# Patient Record
Sex: Female | Born: 1965 | Race: White | Hispanic: No | Smoking: Current every day smoker
Health system: Southern US, Community
[De-identification: ages and names within clinical notes are randomized; demographics above are authoritative.]

## PROBLEM LIST (undated history)

## (undated) DIAGNOSIS — D649 Anemia, unspecified: Secondary | ICD-10-CM

## (undated) DIAGNOSIS — N6092 Unspecified benign mammary dysplasia of left breast: Principal | ICD-10-CM

## (undated) DIAGNOSIS — O903 Peripartum cardiomyopathy: Secondary | ICD-10-CM

## (undated) DIAGNOSIS — R94130 Abnormal response to nerve stimulation, unspecified: Secondary | ICD-10-CM

## (undated) DIAGNOSIS — I509 Heart failure, unspecified: Secondary | ICD-10-CM

## (undated) HISTORY — DX: Peripartum cardiomyopathy: O90.3

## (undated) HISTORY — PX: TUBAL LIGATION: SHX77

## (undated) HISTORY — DX: Abnormal response to nerve stimulation, unspecified: R94.130

---
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
## 1987-05-30 HISTORY — PX: DILATION AND CURETTAGE OF UTERUS: SHX78

## 1997-12-21 ENCOUNTER — Other Ambulatory Visit: Admission: RE | Admit: 1997-12-21 | Discharge: 1997-12-21 | Payer: Self-pay | Admitting: *Deleted

## 1998-12-23 ENCOUNTER — Other Ambulatory Visit: Admission: RE | Admit: 1998-12-23 | Discharge: 1998-12-23 | Payer: Self-pay | Admitting: *Deleted

## 1999-06-28 ENCOUNTER — Encounter: Payer: Self-pay | Admitting: *Deleted

## 1999-06-28 ENCOUNTER — Emergency Department (HOSPITAL_COMMUNITY): Admission: EM | Admit: 1999-06-28 | Discharge: 1999-06-28 | Payer: Self-pay | Admitting: Emergency Medicine

## 2002-11-03 ENCOUNTER — Emergency Department (HOSPITAL_COMMUNITY): Admission: EM | Admit: 2002-11-03 | Discharge: 2002-11-03 | Payer: Self-pay | Admitting: Emergency Medicine

## 2003-10-17 ENCOUNTER — Emergency Department (HOSPITAL_COMMUNITY): Admission: EM | Admit: 2003-10-17 | Discharge: 2003-10-17 | Payer: Self-pay | Admitting: *Deleted

## 2004-05-29 HISTORY — PX: SPINE SURGERY: SHX786

## 2004-08-16 ENCOUNTER — Ambulatory Visit (HOSPITAL_COMMUNITY): Admission: RE | Admit: 2004-08-16 | Discharge: 2004-08-17 | Payer: Self-pay | Admitting: Neurosurgery

## 2005-07-06 ENCOUNTER — Emergency Department (HOSPITAL_COMMUNITY): Admission: EM | Admit: 2005-07-06 | Discharge: 2005-07-06 | Payer: Self-pay | Admitting: Emergency Medicine

## 2010-05-03 LAB — HM PAP SMEAR

## 2012-05-17 ENCOUNTER — Other Ambulatory Visit (HOSPITAL_COMMUNITY): Payer: Self-pay | Admitting: Family Medicine

## 2012-05-17 DIAGNOSIS — O903 Peripartum cardiomyopathy: Secondary | ICD-10-CM

## 2012-06-07 ENCOUNTER — Ambulatory Visit (HOSPITAL_COMMUNITY)
Admission: RE | Admit: 2012-06-07 | Discharge: 2012-06-07 | Disposition: A | Discharge status: Home / Self Care | Payer: 59 | Source: Ambulatory Visit | Attending: Family Medicine | Admitting: Family Medicine

## 2012-06-07 DIAGNOSIS — I428 Other cardiomyopathies: Secondary | ICD-10-CM | POA: Diagnosis present

## 2012-06-07 DIAGNOSIS — O903 Peripartum cardiomyopathy: Secondary | ICD-10-CM

## 2012-06-07 DIAGNOSIS — F17200 Nicotine dependence, unspecified, uncomplicated: Secondary | ICD-10-CM | POA: Insufficient documentation

## 2012-06-07 NOTE — Progress Notes (Signed)
[facility]   2D echo completed 06/07/2012.   Veda Canning, RDCS

## 2012-09-05 ENCOUNTER — Encounter: Payer: Self-pay | Admitting: *Deleted

## 2012-09-06 ENCOUNTER — Ambulatory Visit (INDEPENDENT_AMBULATORY_CARE_PROVIDER_SITE_OTHER): Payer: 59 | Admitting: Physician Assistant

## 2012-09-06 ENCOUNTER — Ambulatory Visit: Payer: Self-pay | Admitting: Physician Assistant

## 2012-09-06 ENCOUNTER — Encounter: Payer: Self-pay | Admitting: Physician Assistant

## 2012-09-06 VITALS — BP 142/88 | HR 92 | Temp 98.8°F | Resp 18 | Ht 64.5 in | Wt 172.0 lb

## 2012-09-06 DIAGNOSIS — J988 Other specified respiratory disorders: Secondary | ICD-10-CM

## 2012-09-06 DIAGNOSIS — A499 Bacterial infection, unspecified: Secondary | ICD-10-CM

## 2012-09-06 DIAGNOSIS — B9689 Other specified bacterial agents as the cause of diseases classified elsewhere: Secondary | ICD-10-CM

## 2012-09-06 MED ORDER — HYDROCODONE-HOMATROPINE 5-1.5 MG/5ML PO SYRP: 5.0000 mL | ORAL_SOLUTION | Freq: Three times a day (TID) | ORAL | Status: DC | PRN

## 2012-09-06 NOTE — Progress Notes (Signed)
   Patient ID: Lisa Gates MRN: [id number], DOB: May 21, 1966, 47 y.o. Date of Encounter: 09/06/2012, 11:41 AM    Chief Complaint:  Chief Complaint  Patient presents with  . Cough    persistant x 1 week  worse at night  [facility]     HPI: 47 y.o. year old female says she has had this congestion for about 2 weeks. Was unable to get an appt here [facility] "but wasnot very impressed with them" so here for f/u. Has a lot of drainage in throat causing cough. Gets very little from nose. No sore throat, ear pain, fever, chills.  [facility] prescribed Doxycycline 100 BID x 7days, tessalon, and cheratussin.  Still has lots of cough at night. Not getting much sleep.    Home Meds: Current Outpatient Prescriptions on File Prior to Visit  Medication Sig Dispense Refill  . promethazine (PHENERGAN) 25 MG tablet Take 25 mg by mouth every 6 (six) hours as needed for nausea.       No current facility-administered medications on file prior to visit.    Allergies:  Allergies  Allergen Reactions  . Penicillins       Review of Systems: Constitutional: negative for chills, fever, night sweats, weight changes, or fatigue  HEENT: negative for vision changes, hearing loss, congestion, rhinorrhea, ST, epistaxis, or sinus pressure Cardiovascular: negative for chest pain or palpitations Respiratory: negative for hemoptysis, wheezing, shortness of breath Abdominal: negative for abdominal pain, nausea, vomiting, diarrhea, or constipation Dermatological: negative for rash Neurologic: negative for headache, dizziness, or syncope    Physical Exam: Blood pressure 142/88, pulse 92, temperature 98.8 F (37.1 C), temperature source Oral, resp. rate 18, height 5' 4.5" (1.638 m), weight 172 lb (78.019 kg), last menstrual period 08/15/2012., Body mass index is 29.08 kg/(m^2). General: Well developed, well nourished,WF in no acute distress. HEENT: Normocephalic,  atraumatic, eyes without discharge, sclera non-icteric, nares are without discharge. Bilateral auditory canals clear, TM's are without perforation, pearly grey and translucent with reflective cone of light bilaterally. Oral cavity moist, posterior pharynx without exudate, erythema, peritonsillar abscess. Some post nasal drip.  Neck: Supple. No thyromegaly. Full ROM. No lymphadenopathy. Lungs: Clear bilaterally to auscultation without wheezes, rales, or rhonchi. Breathing is unlabored. Heart: RRR with S1 S2. No murmurs, rubs, or gallops. Msk:  Strength and tone normal for age. Extremities/Skin: Warm and dry. No clubbing or cyanosis. No edema. No rashes or suspicious lesions. Neuro: Alert and oriented X 3. Moves all extremities spontaneously. Gait is normal. CNII-XII grossly in tact. Psych:  Responds to questions appropriately with a normal affect.    ASSESSMENT AND PLAN:  47 y.o. year old female with  1. Bacterial respiratory infection Complete Doxycycline. If cough does not resolve after completion of abx, f/u. Will prescribe a different anti-tussive to use instead of current cough suppressants. - HYDROcodone-homatropine (HYCODAN) 5-1.5 MG/5ML syrup; Take 5 mLs by mouth every 8 (eight) hours as needed for cough.  Dispense: 120 mL; Refill: 0   Signed, [address] [place], [place], [facility] 09/06/2012 11:41 AM

## 2013-01-30 ENCOUNTER — Ambulatory Visit (INDEPENDENT_AMBULATORY_CARE_PROVIDER_SITE_OTHER): Payer: 59 | Admitting: Physician Assistant

## 2013-01-30 ENCOUNTER — Encounter: Payer: Self-pay | Admitting: Physician Assistant

## 2013-01-30 VITALS — BP 136/88 | HR 84 | Temp 98.2°F | Resp 18 | Wt 176.0 lb

## 2013-01-30 DIAGNOSIS — M79622 Pain in left upper arm: Secondary | ICD-10-CM

## 2013-01-30 DIAGNOSIS — M79609 Pain in unspecified limb: Secondary | ICD-10-CM

## 2013-01-30 NOTE — Telephone Encounter (Signed)
This encounter was created in error - please disregard.
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
## 2013-01-30 NOTE — Telephone Encounter (Signed)
Can you make erroneous please? She called saying she needed to leave a message, but just needed to make an appt. Sorry!

## 2013-03-01 DIAGNOSIS — R94130 Abnormal response to nerve stimulation, unspecified: Secondary | ICD-10-CM

## 2013-03-01 HISTORY — DX: Abnormal response to nerve stimulation, unspecified: R94.130

## 2013-04-23 ENCOUNTER — Encounter: Payer: Self-pay | Admitting: Family Medicine

## 2013-11-12 ENCOUNTER — Ambulatory Visit (INDEPENDENT_AMBULATORY_CARE_PROVIDER_SITE_OTHER): Payer: 59 | Admitting: Physician Assistant

## 2013-11-12 ENCOUNTER — Encounter: Payer: Self-pay | Admitting: Physician Assistant

## 2013-11-12 VITALS — BP 122/82 | HR 80 | Temp 98.4°F | Resp 18 | Wt 173.0 lb

## 2013-11-12 DIAGNOSIS — J029 Acute pharyngitis, unspecified: Secondary | ICD-10-CM

## 2013-11-12 LAB — RAPID STREP SCREEN (MED CTR MEBANE ONLY): Streptococcus, Group A Screen (Direct): NEGATIVE

## 2013-11-12 MED ORDER — AZITHROMYCIN 250 MG PO TABS: ORAL_TABLET | ORAL | Status: DC

## 2013-11-12 NOTE — Progress Notes (Signed)
Patient ID: Lisa Gates MRN: [id number], DOB: December 06, 1965, 48 y.o. Date of Encounter: 11/12/2013, 11:23 AM    Chief Complaint:  Chief Complaint  Patient presents with  . fever/sore throat x 2 days    using OTC Dayquil/Motrin     HPI: 48 y.o. year old white female says that this started on the afternoon of Sunday 11/09/13. Says she has had a fever between 99 - 100. Has had pain in the left side of her throat. Says she "feels like crap". However has had no significant nasal congestion or mucous from her nose. Also no cough. Says the pain in her throat has gotten no better. She has continued to have a fever of 99-100 especially in the evening/night.     Home Meds:   Outpatient Prescriptions Prior to Visit  Medication Sig Dispense Refill  . guaiFENesin-codeine (ROBITUSSIN AC) 100-10 MG/5ML syrup Take 5 mLs by mouth at bedtime as needed for cough.      Marland Kitchen. HYDROcodone-homatropine (HYCODAN) 5-1.5 MG/5ML syrup Take 5 mLs by mouth every 8 (eight) hours as needed for cough.  120 mL  0  . promethazine (PHENERGAN) 25 MG tablet Take 25 mg by mouth every 6 (six) hours as needed for nausea.       No facility-administered medications prior to visit.    Allergies:  Allergies  Allergen Reactions  . Penicillins       Review of Systems: See HPI for pertinent ROS. All other ROS negative.    Physical Exam: Blood pressure 122/82, pulse 80, temperature 98.4 F (36.9 C), temperature source Oral, resp. rate 18, weight 173 lb (78.472 kg)., Body mass index is 29.25 kg/(m^2). General: WNWD WF. Appears in no acute distress. HEENT: Normocephalic, atraumatic, eyes without discharge, sclera non-icteric, nares are without discharge. Bilateral auditory canals clear, TM's are without perforation, pearly grey and translucent with reflective cone of light bilaterally. Oral cavity moist. The left tonsil has moderate erythema and small amount of exudate along the medial edge. Right tonsil with minimal  erythema and no exudate. No peritonsillar abscess.  Neck: Supple. Reports minimal tenderness with palpation of the left tonsillar node. Reports no tenderness with palpation of cervical nodes. I Palpate no enlarged nodes. Lungs: Clear bilaterally to auscultation without wheezes, rales, or rhonchi. Breathing is unlabored. Heart: Regular rhythm. No murmurs, rubs, or gallops. Msk:  Strength and tone normal for age. Extremities/Skin: Warm and dry.  No rashes. Neuro: Alert and oriented X 3. Moves all extremities spontaneously. Gait is normal. CNII-XII grossly in tact. Psych:  Responds to questions appropriately with a normal affect.      ASSESSMENT AND PLAN:  48 y.o. year old female with  1. Acute pharyngitis Rapid strep test is negative. Discussed that most cases of pharyngitis are caused by viruses. Discussed that if this is caused by virus, then it will run its course and resolve on its own. Recommended that she monitor her symptoms for another 48 hours. If the fever does not resolve over the next 48 hours and if the sore throat pain is not resolving over the next 48 hours then would start the antibiotics and make sure to take them as directed and complete all of them. However if the fever resolves over the next 48 hours and her sore throat is improving over the next several days and ultimately resolves on its own, then this is consistent with this being a virus and she should not take antibiotics. Rather than having to call us back,  she wanted me to go ahead and send in antibiotics so they would be at the pharmacy if needed.  She is to use over-the-counter lozenges, spray, Tylenol and Motrin as needed for pain as well as fever reduction. She has allergy to Penicillin.  - azithromycin (ZITHROMAX) 250 MG tablet; Take 2 daily for 5 days  Dispense: 10 tablet; Refill: 0  2. Sorethroat - Rapid Strep Screen   Signed, Shon HaleMary Beth St. JosephDixon, [place], [facility] 11/12/2013 11:23 AM

## 2013-11-21 ENCOUNTER — Other Ambulatory Visit: Payer: 59

## 2013-11-21 ENCOUNTER — Other Ambulatory Visit: Payer: Self-pay | Admitting: Physician Assistant

## 2013-11-21 DIAGNOSIS — Z0000 Encounter for general adult medical examination without abnormal findings: Secondary | ICD-10-CM

## 2013-11-21 LAB — LIPID PANEL
Cholesterol: 207 mg/dL — ABNORMAL HIGH (ref 0–200)
HDL: 41 mg/dL (ref >39)
Total CHOL/HDL Ratio: 5 Ratio
Triglycerides: 444 mg/dL — ABNORMAL HIGH (ref <150)

## 2013-11-21 LAB — COMPLETE METABOLIC PANEL WITH GFR
ALT: 16 U/L (ref 0–35)
AST: 20 U/L (ref 0–37)
Albumin: 4.6 g/dL (ref 3.5–5.2)
Alkaline Phosphatase: 129 U/L — ABNORMAL HIGH (ref 39–117)
BUN: 11 mg/dL (ref 6–23)
CO2: 21 mEq/L (ref 19–32)
Calcium: 9.5 mg/dL (ref 8.4–10.5)
Chloride: 111 mEq/L (ref 96–112)
Creat: 0.6 mg/dL (ref 0.50–1.10)
GFR, Est African American: >89 mL/min
GFR, Est Non African American: >89 mL/min
Glucose, Bld: 79 mg/dL (ref 70–99)
Potassium: 4.1 mEq/L (ref 3.5–5.3)
Sodium: 137 mEq/L (ref 135–145)
Total Bilirubin: 0.3 mg/dL (ref 0.2–1.2)
Total Protein: 7.4 g/dL (ref 6.0–8.3)

## 2013-11-21 LAB — CBC WITH DIFFERENTIAL/PLATELET
Basophils Absolute: 0.1 10*3/uL (ref 0.0–0.1)
Basophils Relative: 1 % (ref 0–1)
Eosinophils Absolute: 0.1 10*3/uL (ref 0.0–0.7)
Eosinophils Relative: 2 % (ref 0–5)
HCT: 32.1 % — ABNORMAL LOW (ref 36.0–46.0)
Hemoglobin: 10.4 g/dL — ABNORMAL LOW (ref 12.0–15.0)
Lymphocytes Relative: 28 % (ref 12–46)
Lymphs Abs: 1.8 10*3/uL (ref 0.7–4.0)
MCH: 25.9 pg — ABNORMAL LOW (ref 26.0–34.0)
MCHC: 32.4 g/dL (ref 30.0–36.0)
MCV: 79.9 fL (ref 78.0–100.0)
Monocytes Absolute: 0.7 10*3/uL (ref 0.1–1.0)
Monocytes Relative: 10 % (ref 3–12)
Neutro Abs: 3.9 10*3/uL (ref 1.7–7.7)
Neutrophils Relative %: 59 % (ref 43–77)
Platelets: 357 10*3/uL (ref 150–400)
RBC: 4.02 MIL/uL (ref 3.87–5.11)
RDW: 15.4 % (ref 11.5–15.5)
WBC: 6.6 10*3/uL (ref 4.0–10.5)

## 2013-11-21 LAB — TSH: TSH: 3.825 u[IU]/mL (ref 0.350–4.500)

## 2013-11-27 ENCOUNTER — Encounter: Payer: Self-pay | Admitting: Physician Assistant

## 2013-11-27 ENCOUNTER — Ambulatory Visit (INDEPENDENT_AMBULATORY_CARE_PROVIDER_SITE_OTHER): Payer: 59 | Admitting: Physician Assistant

## 2013-11-27 VITALS — BP 126/86 | HR 84 | Temp 99.0°F | Resp 18

## 2013-11-27 DIAGNOSIS — N912 Amenorrhea, unspecified: Secondary | ICD-10-CM

## 2013-11-27 DIAGNOSIS — F17200 Nicotine dependence, unspecified, uncomplicated: Secondary | ICD-10-CM

## 2013-11-27 DIAGNOSIS — E781 Pure hyperglyceridemia: Secondary | ICD-10-CM

## 2013-11-27 DIAGNOSIS — Z0000 Encounter for general adult medical examination without abnormal findings: Secondary | ICD-10-CM

## 2013-11-27 DIAGNOSIS — D649 Anemia, unspecified: Secondary | ICD-10-CM

## 2013-11-27 LAB — FERRITIN: Ferritin: 5 ng/mL — ABNORMAL LOW (ref 10–291)

## 2013-11-27 LAB — IRON AND TIBC
%SAT: 8 % — ABNORMAL LOW (ref 20–55)
Iron: 30 ug/dL — ABNORMAL LOW (ref 42–145)
TIBC: 399 ug/dL (ref 250–470)
UIBC: 369 ug/dL (ref 125–400)

## 2013-11-27 LAB — VITAMIN B12: Vitamin B-12: 355 pg/mL (ref 211–911)

## 2013-11-27 MED ORDER — FENOFIBRATE 145 MG PO TABS: 145.0000 mg | ORAL_TABLET | Freq: Every day | ORAL | Status: DC
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
## 2013-11-28 LAB — FSH/LH
FSH: 4.8 m[IU]/mL
LH: 9.6 m[IU]/mL

## 2013-11-28 LAB — PROLACTIN: Prolactin: 11 ng/mL

## 2013-12-01 LAB — PREGNANCY, URINE: Preg Test, Ur: NEGATIVE

## 2013-12-02 ENCOUNTER — Telehealth: Payer: Self-pay | Admitting: *Deleted

## 2013-12-02 DIAGNOSIS — E781 Pure hyperglyceridemia: Secondary | ICD-10-CM

## 2013-12-02 LAB — PAP, THIN PREP W/HPV RFLX HPV TYPE 16/18: HPV DNA High Risk: NOT DETECTED

## 2013-12-02 NOTE — Telephone Encounter (Signed)
Received fax from pharmacy.   Reports that Fenofibrate 145mg  is not on prescription formulary. The preferred drugs are Fenofibrate 54mg  and fenofibrate 160mg .   Please advise.

## 2013-12-03 MED ORDER — FENOFIBRATE 160 MG PO TABS: 160.0000 mg | ORAL_TABLET | Freq: Every day | ORAL | Status: DC

## 2013-12-03 NOTE — Telephone Encounter (Signed)
Med sent to pharm 

## 2013-12-03 NOTE — Telephone Encounter (Signed)
Fenofibrate 160mg

## 2013-12-16 ENCOUNTER — Other Ambulatory Visit: Payer: 59

## 2013-12-16 DIAGNOSIS — D649 Anemia, unspecified: Secondary | ICD-10-CM

## 2013-12-17 ENCOUNTER — Encounter: Payer: Self-pay | Admitting: Family Medicine

## 2013-12-17 LAB — FECAL OCCULT BLOOD, IMMUNOCHEMICAL: Fecal Occult Blood: NEGATIVE

## 2013-12-22 ENCOUNTER — Other Ambulatory Visit: Payer: 59

## 2013-12-22 DIAGNOSIS — E781 Pure hyperglyceridemia: Secondary | ICD-10-CM

## 2013-12-22 DIAGNOSIS — D649 Anemia, unspecified: Secondary | ICD-10-CM

## 2013-12-23 ENCOUNTER — Telehealth: Payer: Self-pay | Admitting: Family Medicine

## 2013-12-23 LAB — FECAL OCCULT BLOOD, IMMUNOCHEMICAL
Fecal Occult Blood: NEGATIVE
Fecal Occult Blood: NEGATIVE

## 2013-12-23 NOTE — Telephone Encounter (Signed)
Message copied by Donne AnonPLUMMER, Tasheka Houseman M on Tue Dec 23, 2013  4:22 PM ------      Message from: Allayne ButcherIXON, MARY      Created: Mon Dec 22, 2013  5:06 PM       Tell pt pap smear normal. ------

## 2013-12-23 NOTE — Telephone Encounter (Signed)
Message copied by Donne AnonPLUMMER, KIM M on Tue Dec 23, 2013  4:18 PM ------      Message from: Allayne ButcherIXON, MARY      Created: Mon Dec 22, 2013  5:08 PM       I am tired of waiting on Hemoccult cards!!! This all started 11/21/13 !!! Have received one Hemoccult card and I was negative.      The patient to start ferrous sulfate 325 mg 1 by mouth 3 times a day. Prescription for #90+5 refills.      Tell her that this may cause constipation so she needs to increase water intake and fiber intake. If she does have constipation despite the water and fiber, she can take over-the-counter stool softener. ------

## 2013-12-23 NOTE — Telephone Encounter (Signed)
Have left message for patient to call back.  Sent Rx for iron to pharmacy.

## 2013-12-24 MED ORDER — FERROUS SULFATE 325 (65 FE) MG PO TABS: 325.0000 mg | ORAL_TABLET | Freq: Three times a day (TID) | ORAL | Status: DC

## 2013-12-25 ENCOUNTER — Telehealth: Payer: Self-pay | Admitting: Physician Assistant

## 2013-12-25 NOTE — Telephone Encounter (Signed)
Patient said she is trying to get back in touch with you, please call her back at [phone]

## 2013-12-25 NOTE — Telephone Encounter (Signed)
Call returned to patient.    Labs discussed.

## 2013-12-25 NOTE — Telephone Encounter (Signed)
Call placed to patient and patient made aware.  

## 2014-03-16 ENCOUNTER — Telehealth: Payer: Self-pay | Admitting: Physician Assistant

## 2014-03-16 DIAGNOSIS — N644 Mastodynia: Secondary | ICD-10-CM

## 2014-03-16 DIAGNOSIS — N926 Irregular menstruation, unspecified: Secondary | ICD-10-CM

## 2014-03-16 NOTE — Telephone Encounter (Signed)
Patient would like referral to gynecologist if possible  [phone]

## 2014-03-17 NOTE — Telephone Encounter (Signed)
Called pt and she states she is having breast pain that has lasted 3mos to where it feels like someone is cutting on her hurts more when she wakes up in the morning. Also, states she is having some missed periods as well, but is concentrating more on breast pain, I recommended a mammogram and she just wants to go see GYN at this time. OK to do referral? Pt has suggested maybe going to see Dr. Henderson Cloudomblin.

## 2014-03-17 NOTE — Telephone Encounter (Signed)
Referral placed to GYN 

## 2014-03-17 NOTE — Telephone Encounter (Signed)
I reviewed my last office note. She was having these problems at the time of that visit as well. TSH FSH and LH were all checked. Can go ahead with GYN referral to the requested GYN.--I think it was Dr. Henderson Cloudomblin--?

## 2014-04-02 ENCOUNTER — Other Ambulatory Visit: Payer: Self-pay | Admitting: Family Medicine

## 2014-07-10 ENCOUNTER — Other Ambulatory Visit: Payer: Self-pay | Admitting: Physician Assistant

## 2014-07-10 NOTE — Telephone Encounter (Signed)
Medication refilled per protocol. 

## 2014-07-23 ENCOUNTER — Telehealth: Payer: Self-pay | Admitting: Physician Assistant

## 2014-07-23 NOTE — Telephone Encounter (Signed)
Patient did not specify a reason for her wanting to talk to you, but would like you to call her at [phone]

## 2014-07-24 ENCOUNTER — Other Ambulatory Visit: Payer: 59

## 2014-07-24 DIAGNOSIS — E785 Hyperlipidemia, unspecified: Secondary | ICD-10-CM

## 2014-07-24 DIAGNOSIS — F17200 Nicotine dependence, unspecified, uncomplicated: Secondary | ICD-10-CM

## 2014-07-24 DIAGNOSIS — Z79899 Other long term (current) drug therapy: Secondary | ICD-10-CM

## 2014-07-24 LAB — CBC WITH DIFFERENTIAL/PLATELET
Basophils Absolute: 0.1 10*3/uL (ref 0.0–0.1)
Basophils Relative: 1 % (ref 0–1)
Eosinophils Absolute: 0.1 10*3/uL (ref 0.0–0.7)
Eosinophils Relative: 1 % (ref 0–5)
HCT: 41.8 % (ref 36.0–46.0)
Hemoglobin: 13.9 g/dL (ref 12.0–15.0)
Lymphocytes Relative: 19 % (ref 12–46)
Lymphs Abs: 1.5 10*3/uL (ref 0.7–4.0)
MCH: 32.8 pg (ref 26.0–34.0)
MCHC: 33.3 g/dL (ref 30.0–36.0)
MCV: 98.6 fL (ref 78.0–100.0)
MPV: 10.7 fL (ref 8.6–12.4)
Monocytes Absolute: 0.6 10*3/uL (ref 0.1–1.0)
Monocytes Relative: 8 % (ref 3–12)
Neutro Abs: 5.8 10*3/uL (ref 1.7–7.7)
Neutrophils Relative %: 71 % (ref 43–77)
Platelets: 300 10*3/uL (ref 150–400)
RBC: 4.24 MIL/uL (ref 3.87–5.11)
RDW: 12.6 % (ref 11.5–15.5)
WBC: 8.1 10*3/uL (ref 4.0–10.5)

## 2014-07-24 LAB — COMPLETE METABOLIC PANEL WITH GFR
ALT: 14 U/L (ref 0–35)
AST: 16 U/L (ref 0–37)
Albumin: 4.1 g/dL (ref 3.5–5.2)
Alkaline Phosphatase: 70 U/L (ref 39–117)
BUN: 14 mg/dL (ref 6–23)
CO2: 20 mEq/L (ref 19–32)
Calcium: 9.3 mg/dL (ref 8.4–10.5)
Chloride: 107 mEq/L (ref 96–112)
Creat: 0.78 mg/dL (ref 0.50–1.10)
GFR, Est African American: >89 mL/min
GFR, Est Non African American: >89 mL/min
Glucose, Bld: 72 mg/dL (ref 70–99)
Potassium: 3.9 mEq/L (ref 3.5–5.3)
Sodium: 139 mEq/L (ref 135–145)
Total Bilirubin: 0.4 mg/dL (ref 0.2–1.2)
Total Protein: 6.6 g/dL (ref 6.0–8.3)

## 2014-07-24 LAB — LIPID PANEL
Cholesterol: 187 mg/dL (ref 0–200)
HDL: 56 mg/dL (ref >=46)
LDL Cholesterol: 104 mg/dL — ABNORMAL HIGH (ref 0–99)
Total CHOL/HDL Ratio: 3.3 Ratio
Triglycerides: 136 mg/dL (ref <150)
VLDL: 27 mg/dL (ref 0–40)

## 2014-07-24 NOTE — Telephone Encounter (Signed)
Pt came into office this am and had a couple of questions about getting her blood work done and needing some information to get husband in

## 2014-07-31 ENCOUNTER — Ambulatory Visit (INDEPENDENT_AMBULATORY_CARE_PROVIDER_SITE_OTHER): Payer: 59 | Admitting: Family Medicine

## 2014-07-31 ENCOUNTER — Encounter: Payer: Self-pay | Admitting: Family Medicine

## 2014-07-31 VITALS — BP 128/74 | HR 82 | Temp 98.2°F | Resp 16 | Ht 65.0 in | Wt 170.0 lb

## 2014-07-31 DIAGNOSIS — D509 Iron deficiency anemia, unspecified: Secondary | ICD-10-CM | POA: Diagnosis not present

## 2014-07-31 DIAGNOSIS — E781 Pure hyperglyceridemia: Secondary | ICD-10-CM

## 2014-07-31 DIAGNOSIS — Z1231 Encounter for screening mammogram for malignant neoplasm of breast: Secondary | ICD-10-CM

## 2014-07-31 NOTE — Progress Notes (Signed)
Subjective:    Patient ID: Lisa Gates, female    DOB: June 10, 1965, 49 y.o.   MRN: [id number]  HPI Patient is here today for follow-up of her hypertriglyceridemia as well as her iron deficiency anemia. She's been taking iron sulfate 325 mg by mouth 3 times a day. Her hemoglobin has risen from just greater than 10-13.9 since last year. She is also on fenofibrate 160 mg by mouth daily for hypertriglyceridemia. Her triglycerides have fallen from over 400 to under 150. Her LDL cholesterol is also acceptable. Lab on 07/24/2014  Component Date Value Ref Range Status  . Sodium 07/24/2014 139  135 - 145 mEq/L Final  . Potassium 07/24/2014 3.9  3.5 - 5.3 mEq/L Final  . Chloride 07/24/2014 107  96 - 112 mEq/L Final  . CO2 07/24/2014 20  19 - 32 mEq/L Final  . Glucose, Bld 07/24/2014 72  70 - 99 mg/dL Final  . BUN 07/24/2014 14  6 - 23 mg/dL Final  . Creat 07/24/2014 0.78  0.50 - 1.10 mg/dL Final  . Total Bilirubin 07/24/2014 0.4  0.2 - 1.2 mg/dL Final  . Alkaline Phosphatase 07/24/2014 70  39 - 117 U/L Final  . AST 07/24/2014 16  0 - 37 U/L Final  . ALT 07/24/2014 14  0 - 35 U/L Final  . Total Protein 07/24/2014 6.6  6.0 - 8.3 g/dL Final  . Albumin 07/24/2014 4.1  3.5 - 5.2 g/dL Final  . Calcium 07/24/2014 9.3  8.4 - 10.5 mg/dL Final  . GFR, Est African American 07/24/2014 >89   Final  . GFR, Est Non African American 07/24/2014 >89   Final   Comment:   The estimated GFR is a calculation valid for adults (>=41 years old) that uses the CKD-EPI algorithm to adjust for age and sex. It is   not to be used for children, pregnant women, hospitalized patients,    patients on dialysis, or with rapidly changing kidney function. According to the NKDEP, eGFR >89 is normal, 60-89 shows mild impairment, 30-59 shows moderate impairment, 15-29 shows severe impairment and <15 is ESRD.     Marland Kitchen Cholesterol 07/24/2014 187  0 - 200 mg/dL Final   Comment: ATP III Classification:       < 200        mg/dL         Desirable      200 - 239     mg/dL        Borderline High      >= 240        mg/dL        High     . Triglycerides 07/24/2014 136  <150 mg/dL Final  . HDL 07/24/2014 56  >=46 mg/dL Final   ** Please note change in reference range(s). **  . Total CHOL/HDL Ratio 07/24/2014 3.3   Final  . VLDL 07/24/2014 27  0 - 40 mg/dL Final  . LDL Cholesterol 07/24/2014 104* 0 - 99 mg/dL Final   Comment:   Total Cholesterol/HDL Ratio:CHD Risk                        Coronary Heart Disease Risk Table                                        Men       Women  1/2 Average Risk              3.4        3.3              Average Risk              5.0        4.4           2X Average Risk              9.6        7.1           3X Average Risk             23.4       11.0 Use the calculated Patient Ratio above and the CHD Risk table  to determine the patient's CHD Risk. ATP III Classification (LDL):       < 100        mg/dL         Optimal      100 - 129     mg/dL         Near or Above Optimal      130 - 159     mg/dL         Borderline High      160 - 189     mg/dL         High       > 190        mg/dL         Very High     . WBC 07/24/2014 8.1  4.0 - 10.5 K/uL Final  . RBC 07/24/2014 4.24  3.87 - 5.11 MIL/uL Final  . Hemoglobin 07/24/2014 13.9  12.0 - 15.0 g/dL Final  . HCT 07/24/2014 41.8  36.0 - 46.0 % Final  . MCV 07/24/2014 98.6  78.0 - 100.0 fL Final  . MCH 07/24/2014 32.8  26.0 - 34.0 pg Final  . MCHC 07/24/2014 33.3  30.0 - 36.0 g/dL Final  . RDW 07/24/2014 12.6  11.5 - 15.5 % Final  . Platelets 07/24/2014 300  150 - 400 K/uL Final  . MPV 07/24/2014 10.7  8.6 - 12.4 fL Final  . Neutrophils Relative % 07/24/2014 71  43 - 77 % Final  . Neutro Abs 07/24/2014 5.8  1.7 - 7.7 K/uL Final  . Lymphocytes Relative 07/24/2014 19  12 - 46 % Final  . Lymphs Abs 07/24/2014 1.5  0.7 - 4.0 K/uL Final  . Monocytes Relative 07/24/2014 8  3 - 12 % Final  . Monocytes Absolute 07/24/2014 0.6  0.1 - 1.0  K/uL Final  . Eosinophils Relative 07/24/2014 1  0 - 5 % Final  . Eosinophils Absolute 07/24/2014 0.1  0.0 - 0.7 K/uL Final  . Basophils Relative 07/24/2014 1  0 - 1 % Final  . Basophils Absolute 07/24/2014 0.1  0.0 - 0.1 K/uL Final  . Smear Review 07/24/2014 Criteria for review not met   Final   Past Medical History  Diagnosis Date  . Abnormal nerve conduction studies 03/01/2013    left arm  . 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 Tubal ligation    . Cesarean section      x 2  . Spine surgery  05/29/2004    lumbar  . Dilation and curettage of uterus  05/30/1987   Current Outpatient Prescriptions on File Prior to  Visit  Medication Sig Dispense Refill  . fenofibrate 160 MG tablet TAKE 1 TABLET EVERY DAY 30 tablet 3  . ferrous sulfate 325 (65 FE) MG tablet TAKE 1 TABLET THREE TIMES A DAY WITH MEALS 90 tablet 5  . OVER THE COUNTER MEDICATION Takes OTC [facility] antiacid     No current facility-administered medications on file prior to visit.   Allergies  Allergen Reactions  . Penicillins    History   Social History  . Marital Status: Married    Spouse Name: N/A  . Number of Children: N/A  . Years of Education: N/A   Occupational History  . Not on file.   Social History Main Topics  . Smoking status: Current Every Day Smoker -- 0.75 packs/day    Types: Cigarettes  . Smokeless tobacco: Never Used  . Alcohol Use: Yes     Comment: occasional  . Drug Use: No  . Sexual Activity: Not on file   Other Topics Concern  . 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   Married   2 children. 67 and 61 y/o.      Review of Systems  All other systems reviewed and are negative.      Objective:   Physical Exam  Cardiovascular: Normal rate, regular rhythm and normal heart sounds.   No murmur heard. Pulmonary/Chest: Effort normal and breath sounds normal. No  respiratory distress. She has no wheezes. She has no rales.  Abdominal: Soft. Bowel sounds are normal. She exhibits no distension. There is no tenderness. There is no rebound and no guarding.  Musculoskeletal: She exhibits no edema.  Vitals reviewed.         Assessment & Plan:  Iron deficiency anemia  Hypertriglyceridemia  Discontinue iron. Begin just a regular daily multivitamin with iron as anemia has improved. Furthermore her heavy bleeding has subsided. I will schedule the patient for regular screening mammogram. Cholesterol is excellent. I will make no changes in her fenofibrate. I did encourage her to quit smoking.

## 2014-08-23 ENCOUNTER — Other Ambulatory Visit: Payer: Self-pay | Admitting: Family Medicine

## 2014-10-14 ENCOUNTER — Ambulatory Visit: Payer: Self-pay

## 2014-10-21 ENCOUNTER — Emergency Department (HOSPITAL_COMMUNITY): Payer: 59

## 2014-10-21 ENCOUNTER — Emergency Department (HOSPITAL_COMMUNITY)
Admission: EM | Admit: 2014-10-21 | Discharge: 2014-10-22 | Disposition: A | Discharge status: Home / Self Care | Payer: 59 | Attending: Emergency Medicine | Admitting: Emergency Medicine

## 2014-10-21 ENCOUNTER — Encounter (HOSPITAL_COMMUNITY): Payer: Self-pay | Admitting: Emergency Medicine

## 2014-10-21 DIAGNOSIS — Z79899 Other long term (current) drug therapy: Secondary | ICD-10-CM | POA: Diagnosis not present

## 2014-10-21 DIAGNOSIS — Z720 Tobacco use: Secondary | ICD-10-CM | POA: Insufficient documentation

## 2014-10-21 DIAGNOSIS — Z880 Allergy status to penicillin: Secondary | ICD-10-CM | POA: Insufficient documentation

## 2014-10-21 DIAGNOSIS — R0602 Shortness of breath: Secondary | ICD-10-CM | POA: Insufficient documentation

## 2014-10-21 DIAGNOSIS — D649 Anemia, unspecified: Secondary | ICD-10-CM | POA: Diagnosis not present

## 2014-10-21 DIAGNOSIS — R079 Chest pain, unspecified: Secondary | ICD-10-CM | POA: Diagnosis present

## 2014-10-21 DIAGNOSIS — I509 Heart failure, unspecified: Secondary | ICD-10-CM | POA: Diagnosis not present

## 2014-10-21 HISTORY — DX: Heart failure, unspecified: I50.9

## 2014-10-21 HISTORY — DX: Anemia, unspecified: D64.9

## 2014-10-21 LAB — CBC
HCT: 39.2 % (ref 36.0–46.0)
Hemoglobin: 13.2 g/dL (ref 12.0–15.0)
MCH: 33.2 pg (ref 26.0–34.0)
MCHC: 33.7 g/dL (ref 30.0–36.0)
MCV: 98.7 fL (ref 78.0–100.0)
Platelets: 299 10*3/uL (ref 150–400)
RBC: 3.97 MIL/uL (ref 3.87–5.11)
RDW: 12.2 % (ref 11.5–15.5)
WBC: 8.3 10*3/uL (ref 4.0–10.5)

## 2014-10-21 LAB — I-STAT TROPONIN, ED: Troponin i, poc: 0 ng/mL (ref 0.00–0.08)

## 2014-10-21 LAB — BASIC METABOLIC PANEL
Anion gap: 9 (ref 5–15)
BUN: 11 mg/dL (ref 6–20)
CO2: 25 mmol/L (ref 22–32)
Calcium: 9.5 mg/dL (ref 8.9–10.3)
Chloride: 104 mmol/L (ref 101–111)
Creatinine, Ser: 0.75 mg/dL (ref 0.44–1.00)
GFR calc Af Amer: >60 mL/min (ref >60)
GFR calc non Af Amer: >60 mL/min (ref >60)
Glucose, Bld: 116 mg/dL — ABNORMAL HIGH (ref 65–99)
Potassium: 3.4 mmol/L — ABNORMAL LOW (ref 3.5–5.1)
Sodium: 138 mmol/L (ref 135–145)

## 2014-10-21 NOTE — ED Notes (Signed)
Patient transported to X-ray 

## 2014-10-21 NOTE — ED Provider Notes (Signed)
CSN: [id number]     Arrival date & time 10/21/14  2250 History  This chart was scribed for Tomasita Crumble, MD by Annye Asa, ED Scribe. This patient was seen in room A06C/A06C and the patient's care was started at 11:41 PM.    Chief Complaint  Patient presents with  . Chest Pain   The history is provided by the patient. No language interpreter was used.    HPI Comments: Lisa Gates is a 49 y.o. female who presents to the Emergency Department complaining of left and central chest pain and pressure with associated SOB beginning tonight around 21:00, exacerbated with deep breathing. Patient report she had one prior experience with similar symptoms that improved with 2 tablets of aspirin at home. She explains that she has prior experience with CHF and tonight's shortness of breath in addition to her chest pain concerned her enough to come to the ED. She took 2 tablets of aspirin at 16:00 today. She denies nausea, vomiting, diaphoresis. She denies pain or swelling in the legs, recent surgery, exogenous estrogen, any recent long-term periods of immobility (e.g. no plane or car trips over 6-8 hours).   She takes triglyceride and iron pills daily. She is a current .75ppd smoker and occasional EtOH user.   Past Medical History  Diagnosis Date  . Abnormal nerve conduction studies 03/01/2013    left arm  . 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 Anemia   . CHF (congestive heart failure)    Past Surgical History  Procedure Laterality Date  . Tubal ligation    . Cesarean section      x 2  . Spine surgery  05/29/2004    lumbar  . Dilation and curettage of uterus  05/30/1987   Family History  Problem Relation Age of Onset  . COPD Mother    History  Substance Use Topics  . Smoking status: Current Every Day Smoker -- 0.75 packs/day    Types: Cigarettes  . Smokeless tobacco: Never Used  . Alcohol Use: Yes     Comment: occasional   OB History    No data available     Review of Systems   Constitutional: Negative for diaphoresis.  Respiratory: Positive for shortness of breath.   Cardiovascular: Positive for chest pain.  Gastrointestinal: Negative for nausea and vomiting.  All other systems reviewed and are negative.   Allergies  Penicillins  Home Medications   Prior to Admission medications   Medication Sig Start Date End Date Taking? Authorizing Provider  fenofibrate 160 MG tablet TAKE 1 TABLET EVERY DAY 08/24/14   Donita Brooks, MD  ferrous sulfate 325 (65 FE) MG tablet TAKE 1 TABLET THREE TIMES A DAY WITH MEALS 07/10/14   Dorena Bodo, PA-C  OVER THE COUNTER MEDICATION Takes OTC [facility] antiacid    Historical Provider, MD   BP 118/81 mmHg  Pulse 77  Resp 12  SpO2 99%  LMP 10/02/2014 Physical Exam  Constitutional: She is oriented to person, place, and time. She appears well-developed and well-nourished. No distress.  HENT:  Head: Normocephalic and atraumatic.  Mouth/Throat: Oropharynx is clear and moist. No oropharyngeal exudate.  Moist mucous membranes  Eyes: EOM are normal. Pupils are equal, round, and reactive to light.  Neck: Normal range of motion. Neck supple. No JVD present.  Cardiovascular: Normal rate, regular rhythm and normal heart sounds.  Exam reveals no gallop and no friction rub.   No murmur heard. Pulmonary/Chest: Effort normal and breath sounds normal. No respiratory  distress. She has no wheezes. She has no rales.  Abdominal: Soft. Bowel sounds are normal. She exhibits no mass. There is no tenderness. There is no rebound and no guarding.  Musculoskeletal: Normal range of motion. She exhibits no edema.  Moves all extremities normally.   Lymphadenopathy:    She has no cervical adenopathy.  Neurological: She is alert and oriented to person, place, and time. She displays normal reflexes.  Skin: Skin is warm and dry. No rash noted.  Psychiatric: She has a normal mood and affect. Her behavior is normal.  Nursing note and vitals  reviewed.   ED Course  Procedures   DIAGNOSTIC STUDIES: Oxygen Saturation is 99% on RA, normal by my interpretation.    COORDINATION OF CARE: 11:48 PM Discussed treatment plan with pt at bedside and pt agreed to plan.   Labs Review Labs Reviewed  BASIC METABOLIC PANEL - Abnormal; Notable for the following:    Potassium 3.4 (*)    Glucose, Bld 116 (*)    All other components within normal limits  CBC  BRAIN NATRIURETIC PEPTIDE  TROPONIN I  Rosezena SensorI-STAT TROPOININ, ED    Imaging Review Dg Chest 2 View  10/22/2014   CLINICAL DATA:  Mid chest pressure.  History of smoking.  EXAM: CHEST  2 VIEW  COMPARISON:  10/17/2003  FINDINGS: Grossly unchanged cardiac silhouette and mediastinal contours. The lungs remain hyperexpanded with diffuse slightly nodular thickening of the pulmonary interstitium. Minimal bibasilar linear heterogeneous opacities are unchanged and favored to represent atelectasis or scar. No new focal airspace opacities. No pleural effusion or pneumothorax. No evidence of edema. No acute osseus abnormalities.  IMPRESSION: Similar findings of lung hyperexpansion and bronchitic change without acute cardiopulmonary disease.   Electronically Signed   By: Simonne ComeJohn  Watts M.D.   On: 10/22/2014 00:03     EKG Interpretation   Date/Time:  Wednesday Oct 21 2014 22:59:44 EDT Ventricular Rate:  88 PR Interval:  154 QRS Duration: 80 QT Interval:  348 QTC Calculation: 421 R Axis:   30 Text Interpretation:  Normal sinus rhythm Cannot rule out Anterior infarct  , age undetermined Abnormal ECG Confirmed by Erroll LunaOni, Chelly Dombeck Ayokunle [phone]([id number])  on 10/21/2014 11:33:49 PM      MDM   Final diagnoses:  Chest pain    Patient presents emergency department for chest pain. She describes it as heaviness and associated with shortness of breath.  Her history is somewhat concerning for ACS. Heart score is currently 2. 2 sets of troponins were obtained and both are negative. EKG does not reveal any signs of  acute ischemia. Patient is perc negative. Patient was monitored overnight in emergency department, she had no further episodes of severe chest pain. She appears in no acute distress. Her vital signs were within her normal limits and she is safe for discharge will close follow up within 3 days.    I personally performed the services described in this documentation, which was scribed in my presence. The recorded information has been reviewed and is accurate.      Tomasita CrumbleAdeleke Indie Boehne, MD 10/22/14 [phone]

## 2014-10-21 NOTE — ED Notes (Signed)
Pt. reports left chest pain / heaviness onset Monday afternoon with mild SOB , no nausea or diaphoresis . Pt. took 2 regular ASA tabs at 4 pm this afternoon .

## 2014-10-22 LAB — BRAIN NATRIURETIC PEPTIDE: B Natriuretic Peptide: 16.3 pg/mL (ref 0.0–100.0)

## 2014-10-22 LAB — TROPONIN I: Troponin I: <0.03 ng/mL (ref <0.031)

## 2014-10-22 NOTE — ED Notes (Signed)
Repeat EKG done - given to Dr. Mora Bellmanni.

## 2014-10-22 NOTE — Discharge Instructions (Signed)
Chest Pain (Nonspecific) Lisa Gates, your EKG and heart markers were normal tonight.  See your primary care physician within 3 days for close follow-up. If any symptoms worsen come back to emergency department immediately. Thank you. It is often hard to give a diagnosis for the cause of chest pain. There is always a chance that your pain could be related to something serious, such as a heart attack or a blood clot in the lungs. You need to follow up with your doctor. HOME CARE  If antibiotic medicine was given, take it as directed by your doctor. Finish the medicine even if you start to feel better.  For the next few days, avoid activities that bring on chest pain. Continue physical activities as told by your doctor.  Do not use any tobacco products. This includes cigarettes, chewing tobacco, and e-cigarettes.  Avoid drinking alcohol.  Only take medicine as told by your doctor.  Follow your doctor's suggestions for more testing if your chest pain does not go away.  Keep all doctor visits you made. GET HELP IF:  Your chest pain does not go away, even after treatment.  You have a rash with blisters on your chest.  You have a fever. GET HELP RIGHT AWAY IF:   You have more pain or pain that spreads to your arm, neck, jaw, back, or belly (abdomen).  You have shortness of breath.  You cough more than usual or cough up blood.  You have very bad back or belly pain.  You feel sick to your stomach (nauseous) or throw up (vomit).  You have very bad weakness.  You pass out (faint).  You have chills. This is an emergency. Do not wait to see if the problems will go away. Call your local emergency services (911 in [place] Do not drive yourself to the hospital. MAKE SURE YOU:   Understand these instructions.  Will watch your condition.  Will get help right away if you are not doing well or get worse. Document Released: 11/01/2007 Document Revised: 05/20/2013 Document Reviewed:  11/01/2007 [facility] Patient Information 2015 [place], [place]. This information is not intended to replace advice given to you by your health care provider. Make sure you discuss any questions you have with your health care provider.

## 2014-10-30 ENCOUNTER — Other Ambulatory Visit: Payer: Self-pay | Admitting: Family Medicine

## 2014-10-30 DIAGNOSIS — Z1231 Encounter for screening mammogram for malignant neoplasm of breast: Secondary | ICD-10-CM

## 2014-11-02 ENCOUNTER — Ambulatory Visit
Admission: RE | Admit: 2014-11-02 | Discharge: 2014-11-02 | Disposition: A | Discharge status: Home / Self Care | Payer: 59 | Source: Ambulatory Visit | Attending: Family Medicine | Admitting: Family Medicine

## 2014-11-02 DIAGNOSIS — Z1231 Encounter for screening mammogram for malignant neoplasm of breast: Secondary | ICD-10-CM

## 2014-11-04 ENCOUNTER — Other Ambulatory Visit: Payer: Self-pay | Admitting: Family Medicine

## 2014-11-04 DIAGNOSIS — R928 Other abnormal and inconclusive findings on diagnostic imaging of breast: Secondary | ICD-10-CM

## 2014-11-06 ENCOUNTER — Ambulatory Visit: Payer: Self-pay

## 2014-11-09 ENCOUNTER — Ambulatory Visit
Admission: RE | Admit: 2014-11-09 | Discharge: 2014-11-09 | Disposition: A | Discharge status: Home / Self Care | Payer: 59 | Source: Ambulatory Visit | Attending: Family Medicine | Admitting: Family Medicine

## 2014-11-09 DIAGNOSIS — R928 Other abnormal and inconclusive findings on diagnostic imaging of breast: Secondary | ICD-10-CM

## 2015-02-11 ENCOUNTER — Ambulatory Visit (INDEPENDENT_AMBULATORY_CARE_PROVIDER_SITE_OTHER): Payer: 59 | Admitting: Family Medicine

## 2015-02-11 ENCOUNTER — Encounter: Payer: Self-pay | Admitting: Family Medicine

## 2015-02-11 ENCOUNTER — Other Ambulatory Visit: Payer: Self-pay | Admitting: Family Medicine

## 2015-02-11 VITALS — BP 100/60 | HR 84 | Temp 98.6°F | Resp 16 | Ht 65.0 in | Wt 162.0 lb

## 2015-02-11 DIAGNOSIS — F411 Generalized anxiety disorder: Secondary | ICD-10-CM

## 2015-02-11 DIAGNOSIS — L299 Pruritus, unspecified: Secondary | ICD-10-CM | POA: Diagnosis not present

## 2015-02-11 MED ORDER — ESCITALOPRAM OXALATE 10 MG PO TABS: 10.0000 mg | ORAL_TABLET | Freq: Every day | ORAL | Status: DC

## 2015-02-11 MED ORDER — PERMETHRIN 5 % EX CREA: 1.0000 "application " | TOPICAL_CREAM | Freq: Once | CUTANEOUS | Status: DC

## 2015-02-11 NOTE — Progress Notes (Signed)
   Subjective:    Patient ID: Lisa Gates, female    DOB: November 09, 1965, 49 y.o.   MRN: [id number]  HPI  Patient reports 3 weeks of diffuse itching. It is primarily worse on her hands at night. She also reports on her torso and her back. There is very little physical evidence of a rash. She does have 2 small 1 mm bumps on her hand and also 2 small 2 mm bumps on her dorsal right shoulder. Otherwise his skin exam is completely normal. She also reports increasing anxiety over the last several years. She is having occasional panic attacks where she will have trouble driving due to anxiety. She also reports mood swings and anger control issues. She denies depression. Her biggest symptoms are probably panic attacks. She denies any symptoms of mania. Past Medical History  Diagnosis Date  . Abnormal nerve conduction studies 03/01/2013    left arm  . 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 Anemia   . CHF (congestive heart failure)    Past Surgical History  Procedure Laterality Date  . Tubal ligation    . Cesarean section      x 2  . Spine surgery  05/29/2004    lumbar  . Dilation and curettage of uterus  05/30/1987   Current Outpatient Prescriptions on File Prior to Visit  Medication Sig Dispense Refill  . fenofibrate 160 MG tablet TAKE 1 TABLET EVERY DAY 30 tablet 11  . ferrous sulfate 325 (65 FE) MG tablet TAKE 1 TABLET THREE TIMES A DAY WITH MEALS (Patient taking differently: TAKE 1 TABLET DAILY.) 90 tablet 5   No current facility-administered medications on file prior to visit.   Allergies  Allergen Reactions  . Penicillins Hives and Rash   Social History   Social History  . Marital Status: Married    Spouse Name: N/A  . Number of Children: N/A  . Years of Education: N/A   Occupational History  . Not on file.   Social History Main Topics  . Smoking status: Current Every Day Smoker -- 0.75 packs/day    Types: Cigarettes  . Smokeless tobacco: Never Used  . Alcohol Use: Yes     Comment: occasional  . Drug Use: No  . Sexual Activity: Not on file   Other Topics Concern  . 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   Married   2 children. 20 and 61 y/o.     Review of Systems  All other systems reviewed and are negative.      Objective:   Physical Exam  Cardiovascular: Normal rate, regular rhythm and normal heart sounds.   Pulmonary/Chest: Effort normal and breath sounds normal.  Skin: No rash noted. No erythema.  Psychiatric: She has a normal mood and affect. Her behavior is normal. Judgment and thought content normal.  Vitals reviewed.         Assessment & Plan:  Pruritic condition - Plan: permethrin (ELIMITE) 5 % cream  GAD (generalized anxiety disorder) - Plan: escitalopram (LEXAPRO) 10 MG tablet  There is no physical exam findings consistent with her itching. Therefore the differential diagnosis includes scabies, uremia, liver problems, or anxiety. I will try empiric treatment for scabies with Elimite cream applied head to toe and rinse off after 8 hours. Recheck in one week. For her generalized anxiety disorder, I will start Lexapro 10 mg a day and recheck in one month.

## 2015-03-31 ENCOUNTER — Telehealth: Payer: Self-pay | Admitting: Family Medicine

## 2015-03-31 NOTE — Telephone Encounter (Signed)
Pt left a message stating that she needs to speak with Dr. Tanya NonesPickard regarding her anxiety medication that he prescribed to her. She requests to only speak with Dr. Tanya NonesPickard only. [phone]

## 2015-03-31 NOTE — Telephone Encounter (Signed)
To MD

## 2015-04-01 ENCOUNTER — Other Ambulatory Visit: Payer: Self-pay | Admitting: Family Medicine

## 2015-04-01 MED ORDER — ESCITALOPRAM OXALATE 20 MG PO TABS: 20.0000 mg | ORAL_TABLET | Freq: Every day | ORAL | Status: DC

## 2015-04-01 NOTE — Telephone Encounter (Signed)
Called patient, itching was due to bed bugs not scabies.  Lexapro has helped some.  She would like to try to increase to 20 m gpoqday and recheck in 1 month.  Rx sent to [facility].

## 2015-04-01 NOTE — Telephone Encounter (Signed)
Please get her questions. Tell her I asked you to get her questions as I will not have time to call her back today.  This will expedite her getting an answer.  If she wants to make changes in meds, she will need to be seen.

## 2015-04-01 NOTE — Telephone Encounter (Signed)
Call placed to patient.   Patient very evasive in answering questions about anxiety. Did state that she is not in crisis and patient sounded appropriate for conversation (i.e. No hysteria, crying, anxiousness, etc.)   Patient reports that she only wanted to discuss with MD "a side note" to last office visit and obtain MD recommendations.   MD to be made aware.

## 2015-05-04 ENCOUNTER — Encounter: Payer: Self-pay | Admitting: Family Medicine

## 2015-05-04 ENCOUNTER — Ambulatory Visit (INDEPENDENT_AMBULATORY_CARE_PROVIDER_SITE_OTHER): Payer: 59 | Admitting: Family Medicine

## 2015-05-04 VITALS — BP 122/64 | HR 72 | Temp 98.0°F | Resp 14 | Ht 65.0 in | Wt 164.0 lb

## 2015-05-04 DIAGNOSIS — J069 Acute upper respiratory infection, unspecified: Secondary | ICD-10-CM

## 2015-05-04 DIAGNOSIS — Z720 Tobacco use: Secondary | ICD-10-CM | POA: Diagnosis not present

## 2015-05-04 DIAGNOSIS — F17200 Nicotine dependence, unspecified, uncomplicated: Secondary | ICD-10-CM

## 2015-05-04 LAB — RAPID STREP SCREEN (MED CTR MEBANE ONLY): Streptococcus, Group A Screen (Direct): NEGATIVE

## 2015-05-04 MED ORDER — AZITHROMYCIN 250 MG PO TABS: ORAL_TABLET | ORAL | Status: DC

## 2015-05-04 MED ORDER — HYDROCODONE-HOMATROPINE 5-1.5 MG/5ML PO SYRP: 5.0000 mL | ORAL_SOLUTION | Freq: Four times a day (QID) | ORAL | Status: DC | PRN

## 2015-05-04 NOTE — Progress Notes (Signed)
Patient ID: Gilman Buttnerlizabeth W Clarkston, female   DOB: 1966-04-30, 49 y.o.   MRN: [id number]   Subjective:    Patient ID: Gilman ButtnerElizabeth W Salm, female    DOB: 1966-04-30, 49 y.o.   MRN: [id number]  Patient presents for Illness  patient here with cough with mild production sore throat worsening over the past 4 days. She is a smoker. She's not had any fever no chills. Positive sick contact with her daughter. She's been taking Mucinex as well as Tylenol cough and cold. He denies any significant sinus pressure or drainage. She has not had new wheezing or shortness of breath.    Review Of Systems:  GEN- denies fatigue, fever, weight loss,weakness, recent illness HEENT- denies eye drainage, change in vision, nasal discharge, CVS- denies chest pain, palpitations RESP- denies SOB, +cough, wheeze ABD- denies N/V, change in stools, abd pain Neuro- denies headache, dizziness, syncope, seizure activity       Objective:    BP 122/64 mmHg  Pulse 72  Temp(Src) 98 F (36.7 C) (Oral)  Resp 14  Ht 5\' 5"  (1.651 m)  Wt 164 lb (74.39 kg)  BMI 27.29 kg/m2  SpO2 97%  LMP 03/24/2015 GEN- NAD, alert and oriented x3 HEENT- PERRL, EOMI, non injected sclera, pink conjunctiva, MMM, oropharynx mild injection, TM clear bilat no effusion,  No maxillary sinus tenderness, nares clear  Neck- Supple, no LAD CVS- RRR, no murmur RESP-CTAB, irritated cough EXT- No edema Pulses- Radial 2+        Assessment & Plan:      Problem List Items Addressed This Visit    Smoker    Other Visit Diagnoses    Acute URI    -  Primary    Discussed need for tobacco cessation, zpak, cough syrup given, Strep neg    Relevant Medications    azithromycin (ZITHROMAX) 250 MG tablet    Other Relevant Orders    Rapid Strep Screen (Completed)       Note: This dictation was prepared with Dragon dictation along with smaller phrase technology. Any transcriptional errors that result from this process are unintentional.

## 2015-05-04 NOTE — Patient Instructions (Signed)
Take antibiotics Take cough syrup You can use mucinex during the day F/u AS NEEDED

## 2015-07-31 ENCOUNTER — Other Ambulatory Visit: Payer: Self-pay | Admitting: Physician Assistant

## 2015-08-02 NOTE — Telephone Encounter (Signed)
Medication refilled per protocol. 

## 2015-08-30 ENCOUNTER — Other Ambulatory Visit: Payer: Self-pay | Admitting: Family Medicine

## 2015-09-15 ENCOUNTER — Other Ambulatory Visit: Payer: Self-pay | Admitting: Family Medicine

## 2015-09-15 MED ORDER — FENOFIBRATE 160 MG PO TABS: 160.0000 mg | ORAL_TABLET | Freq: Every day | ORAL | Status: DC

## 2015-09-15 MED ORDER — ESCITALOPRAM OXALATE 20 MG PO TABS
20.0000 mg | ORAL_TABLET | Freq: Every day | ORAL | Status: DC
Start: 2015-09-15 — End: 2015-09-28

## 2015-09-15 NOTE — Telephone Encounter (Signed)
Medication called/sent to requested pharmacy  

## 2015-09-28 ENCOUNTER — Other Ambulatory Visit: Payer: Self-pay | Admitting: Family Medicine

## 2015-09-28 NOTE — Telephone Encounter (Signed)
Refill appropriate and filled per protocol. 

## 2015-12-01 ENCOUNTER — Other Ambulatory Visit: Payer: Self-pay | Admitting: Physician Assistant

## 2015-12-02 NOTE — Telephone Encounter (Signed)
Refill appropriate and filled per protocol. 

## 2015-12-06 ENCOUNTER — Other Ambulatory Visit: Payer: Self-pay | Admitting: Family Medicine

## 2015-12-06 DIAGNOSIS — Z1231 Encounter for screening mammogram for malignant neoplasm of breast: Secondary | ICD-10-CM

## 2015-12-14 ENCOUNTER — Ambulatory Visit
Admission: RE | Admit: 2015-12-14 | Discharge: 2015-12-14 | Disposition: A | Discharge status: Home / Self Care | Payer: Managed Care, Other (non HMO) | Source: Ambulatory Visit | Attending: Family Medicine | Admitting: Family Medicine

## 2015-12-14 DIAGNOSIS — Z1231 Encounter for screening mammogram for malignant neoplasm of breast: Secondary | ICD-10-CM

## 2016-02-11 ENCOUNTER — Other Ambulatory Visit: Payer: Self-pay | Admitting: Physician Assistant

## 2016-03-16 ENCOUNTER — Other Ambulatory Visit: Payer: Self-pay | Admitting: Family Medicine

## 2016-03-20 ENCOUNTER — Encounter: Payer: Self-pay | Admitting: Physician Assistant

## 2016-03-20 ENCOUNTER — Ambulatory Visit (INDEPENDENT_AMBULATORY_CARE_PROVIDER_SITE_OTHER): Payer: Managed Care, Other (non HMO) | Admitting: Physician Assistant

## 2016-03-20 VITALS — BP 126/70 | HR 87 | Temp 99.3°F | Resp 16 | Wt 177.0 lb

## 2016-03-20 DIAGNOSIS — J988 Other specified respiratory disorders: Secondary | ICD-10-CM

## 2016-03-20 DIAGNOSIS — J020 Streptococcal pharyngitis: Secondary | ICD-10-CM | POA: Diagnosis not present

## 2016-03-20 DIAGNOSIS — B9689 Other specified bacterial agents as the cause of diseases classified elsewhere: Secondary | ICD-10-CM

## 2016-03-20 LAB — STREP GROUP A AG, W/REFLEX TO CULT: STREGTOCOCCUS GROUP A AG SCREEN: NOT DETECTED

## 2016-03-20 MED ORDER — AZITHROMYCIN 250 MG PO TABS: ORAL_TABLET | ORAL | 0 refills | Status: DC

## 2016-03-20 NOTE — Progress Notes (Signed)
Patient ID: Lisa Gates MRN: [id number], DOB: 1965/12/22, 50 y.o. Date of Encounter: 03/20/2016, 4:24 PM    Chief Complaint:  Chief Complaint  Patient presents with  .   Marland Kitchen Sore Throat     HPI: 50 y.o. year old white female presents with above.   Says that she and her husband both have had the same type of cough for 4 weeks. Says that she does get some phlegm out at times. Says that she has not been on on any antibiotics during these 4 weeks. Has had no evaluation or treatment for this cough.  Says that the cough never progressed and never got really bad so just never came in for that. Says this morning when she woke up she had sore throat so she decided it was time to come in and get checked. She has had no fevers or chills. No mucus from the nose.     Home Meds:   Outpatient Medications Prior to Visit  Medication Sig Dispense Refill  . fenofibrate 160 MG tablet TAKE 1 TABLET EVERY DAY 30 tablet 0  . ferrous sulfate 325 (65 FE) MG tablet TAKE 1 TABLET THREE TIMES A DAY WITH MEALS 90 tablet 5  . azithromycin (ZITHROMAX) 250 MG tablet Take 2 tablets x 1 day, then 1 tab daily for 4 days (Patient not taking: Reported on 03/20/2016) 6 tablet 0  . escitalopram (LEXAPRO) 20 MG tablet TAKE 1 TABLET (20 MG TOTAL) BY MOUTH DAILY. (Patient not taking: Reported on 03/20/2016) 30 tablet 5  . escitalopram (LEXAPRO) 20 MG tablet TAKE 1 TABLET DAILY (Patient not taking: Reported on 03/20/2016) 90 tablet 0  . escitalopram (LEXAPRO) 20 MG tablet TAKE 1 TABLET DAILY (Patient not taking: Reported on 03/20/2016) 90 tablet 0  . fenofibrate 160 MG tablet TAKE 1 TABLET DAILY (NEED OFFICE VISIT AND LABS BEFORE FURTHER REFILLS) (Patient not taking: Reported on 03/20/2016) 90 tablet 3  . HYDROcodone-homatropine (HYCODAN) 5-1.5 MG/5ML syrup Take 5 mLs by mouth every 6 (six) hours as needed for cough. (Patient not taking: Reported on 03/20/2016) 180 mL 0   No facility-administered medications prior  to visit.     Allergies:  Allergies  Allergen Reactions  . Penicillins Hives and Rash      Review of Systems: See HPI for pertinent ROS. All other ROS negative.    Physical Exam: Blood pressure 126/70, pulse 87, temperature 99.3 F (37.4 C), temperature source Oral, resp. rate 16, weight 177 lb (80.3 kg), SpO2 98 %., Body mass index is 29.45 kg/m. General:  WNWD WF. Appears in no acute distress. HEENT: Normocephalic, atraumatic, eyes without discharge, sclera non-icteric, nares are without discharge. Bilateral auditory canals clear, TM's are without perforation, pearly grey and translucent with reflective cone of light bilaterally. Oral cavity moist, posterior pharynx with minimal erythema. No exudate, no peritonsillar abscess.  Neck: Supple. No thyromegaly. No lymphadenopathy. Lungs: Clear bilaterally to auscultation without wheezes, rales, or rhonchi. Breathing is unlabored. Heart: Regular rhythm. No murmurs, rubs, or gallops. Msk:  Strength and tone normal for age. Extremities/Skin: Warm and dry. Neuro: Alert and oriented X 3. Moves all extremities spontaneously. Gait is normal. CNII-XII grossly in tact. Psych:  Responds to questions appropriately with a normal affect.   Results for orders placed or performed in visit on 03/20/16  STREP GROUP A AG, W/REFLEX TO CULT  Result Value Ref Range   SOURCE THROAT    STREGTOCOCCUS GROUP A AG SCREEN Not Detected  ASSESSMENT AND PLAN:  50 y.o. year old female with  1. Bacterial respiratory infection Given that she has had this increased cough with phlegm production for 4 weeks, suspect that she has a bacterial infection that needs to be treated with antibiotic.  She is to take the antibiotic as directed.  If this cough does not resolve, then she needs follow-up and will need chest x-ray. She voices understanding and agrees.  - azithromycin (ZITHROMAX) 250 MG tablet; Take 2 tablets x 1 day, then 1 tab daily for 4 days  Dispense: 6  tablet; Refill: 0    2. Streptococcal sore throat - STREP GROUP A AG, W/REFLEX TO CULT   Signed, [address]Delsa Walder Beth Kelly[place], [place], [facility] 03/20/2016 4:24 PM

## 2016-03-22 LAB — CULTURE, GROUP A STREP: Organism ID, Bacteria: NORMAL

## 2016-06-09 ENCOUNTER — Other Ambulatory Visit: Payer: Self-pay | Admitting: Family Medicine

## 2016-06-09 MED ORDER — FERROUS SULFATE 325 (65 FE) MG PO TABS: ORAL_TABLET | ORAL | 3 refills | Status: DC

## 2016-06-14 ENCOUNTER — Other Ambulatory Visit: Payer: Self-pay | Admitting: Family Medicine

## 2016-09-15 ENCOUNTER — Other Ambulatory Visit: Payer: Self-pay | Admitting: Family Medicine

## 2016-09-15 NOTE — Telephone Encounter (Signed)
Medication refill for one time only.  Patient needs to be seen.  Letter sent for patient to call and schedule. Pt has not had routine visit of lab work since 2016

## 2016-09-26 ENCOUNTER — Ambulatory Visit (INDEPENDENT_AMBULATORY_CARE_PROVIDER_SITE_OTHER): Payer: Managed Care, Other (non HMO) | Admitting: Family Medicine

## 2016-09-26 ENCOUNTER — Encounter: Payer: Self-pay | Admitting: Family Medicine

## 2016-09-26 VITALS — BP 122/80 | HR 76 | Temp 98.3°F | Resp 16 | Ht 65.0 in | Wt 173.0 lb

## 2016-09-26 DIAGNOSIS — R4586 Emotional lability: Secondary | ICD-10-CM

## 2016-09-26 DIAGNOSIS — E781 Pure hyperglyceridemia: Secondary | ICD-10-CM

## 2016-09-26 DIAGNOSIS — F411 Generalized anxiety disorder: Secondary | ICD-10-CM

## 2016-09-26 DIAGNOSIS — F39 Unspecified mood [affective] disorder: Secondary | ICD-10-CM

## 2016-09-26 DIAGNOSIS — F325 Major depressive disorder, single episode, in full remission: Secondary | ICD-10-CM | POA: Diagnosis not present

## 2016-09-26 LAB — LIPID PANEL
Cholesterol: 210 mg/dL — ABNORMAL HIGH (ref <200)
HDL: 61 mg/dL (ref >50)
LDL Cholesterol: 119 mg/dL — ABNORMAL HIGH (ref <100)
Total CHOL/HDL Ratio: 3.4 Ratio (ref <5.0)
Triglycerides: 149 mg/dL (ref <150)
VLDL: 30 mg/dL (ref <30)

## 2016-09-26 LAB — CBC WITH DIFFERENTIAL/PLATELET
Basophils Absolute: 0 cells/uL (ref 0–200)
Basophils Relative: 0 %
Eosinophils Absolute: 122 cells/uL (ref 15–500)
Eosinophils Relative: 1 %
HCT: 40.5 % (ref 35.0–45.0)
Hemoglobin: 13.1 g/dL (ref 12.0–15.0)
Lymphocytes Relative: 13 %
Lymphs Abs: 1586 cells/uL (ref 850–3900)
MCH: 32.3 pg (ref 27.0–33.0)
MCHC: 32.3 g/dL (ref 32.0–36.0)
MCV: 100 fL (ref 80.0–100.0)
MPV: 10.4 fL (ref 7.5–12.5)
Monocytes Absolute: 854 cells/uL (ref 200–950)
Monocytes Relative: 7 %
Neutro Abs: 9638 cells/uL — ABNORMAL HIGH (ref 1500–7800)
Neutrophils Relative %: 79 %
Platelets: 345 10*3/uL (ref 140–400)
RBC: 4.05 MIL/uL (ref 3.80–5.10)
RDW: 12.8 % (ref 11.0–15.0)
WBC: 12.2 10*3/uL — ABNORMAL HIGH (ref 3.8–10.8)

## 2016-09-26 LAB — COMPLETE METABOLIC PANEL WITH GFR
ALT: 15 U/L (ref 6–29)
AST: 16 U/L (ref 10–35)
Albumin: 4 g/dL (ref 3.6–5.1)
Alkaline Phosphatase: 64 U/L (ref 33–115)
BUN: 12 mg/dL (ref 7–25)
CO2: 22 mmol/L (ref 20–31)
Calcium: 9.3 mg/dL (ref 8.6–10.2)
Chloride: 105 mmol/L (ref 98–110)
Creat: 0.63 mg/dL (ref 0.50–1.10)
GFR, Est African American: >89 mL/min (ref >=60)
GFR, Est Non African American: >89 mL/min (ref >=60)
Glucose, Bld: 78 mg/dL (ref 70–99)
Potassium: 4.3 mmol/L (ref 3.5–5.3)
Sodium: 138 mmol/L (ref 135–146)
Total Bilirubin: 0.3 mg/dL (ref 0.2–1.2)
Total Protein: 6.7 g/dL (ref 6.1–8.1)

## 2016-09-26 MED ORDER — LAMOTRIGINE 25 MG PO TABS: 50.0000 mg | ORAL_TABLET | Freq: Every day | ORAL | 3 refills | Status: DC

## 2016-09-26 NOTE — Progress Notes (Signed)
Subjective:    Patient ID: Lisa Gates, female    DOB: 11/16/1965, 51 y.o.   MRN: [id number]  HPI  2016 Patient is here today for follow-up of her hypertriglyceridemia as well as her iron deficiency anemia. She's been taking iron sulfate 325 mg by mouth 3 times a day. Her hemoglobin has risen from just greater than 10-13.9 since last year. She is also on fenofibrate 160 mg by mouth daily for hypertriglyceridemia. Her triglycerides have fallen from over 400 to under 150. Her LDL cholesterol is also acceptable.  At that time, my plan was:  Discontinue iron. Begin just a regular daily multivitamin with iron as anemia has improved. Furthermore her heavy bleeding has subsided. I will schedule the patient for regular screening mammogram. Cholesterol is excellent. I will make no changes in her fenofibrate. I did encourage her to quit smoking.  09/26/2016 Patient is in today for follow-up of her medical conditions. She is still taking fenofibrate 160 mg by mouth daily for hypertriglyceridemia. She denies any myalgias or right upper quadrant pain that she is due to recheck a fasting lipid panel and a CMP. She's been taking Lexapro 20 mg a day for anxiety and mood swings and depression. Her depression and anxiety is much better. However she continues to have severe anger outbursts and mood swings that are nonviolent particularly around the time of her menstrual cycles. She states that for 2-3 weeks every month she becomes "a psychotic b..th."  She loses her temper the drop of a hat. Her husband and her children state that she becomes enraged with very little provocation. She denies any hallucinations. She denies any suicidal ideation. She denies any homicidal ideation. She denies any delusions. She denies any insomnia. Mother may possibly have bipolar Past Medical History:  Diagnosis Date  . Abnormal nerve conduction studies 03/01/2013   left arm  . Anemia   . CHF (congestive heart failure) (HCC)   .  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 CESAREAN SECTION     x 2  . DILATION AND CURETTAGE OF UTERUS  05/30/1987  . SPINE SURGERY  05/29/2004   lumbar  . TUBAL LIGATION     Current Outpatient Prescriptions on File Prior to Visit  Medication Sig Dispense Refill  . escitalopram (LEXAPRO) 20 MG tablet Take 1 tablet (20 mg total) by mouth daily. 30 tablet 0  . fenofibrate 160 MG tablet TAKE 1 TABLET EVERY DAY 30 tablet 0  . ferrous sulfate 325 (65 FE) MG tablet TAKE 1 TABLET THREE TIMES A DAY WITH MEALS 270 tablet 3   No current facility-administered medications on file prior to visit.    Allergies  Allergen Reactions  . Penicillins Hives and Rash   Social History   Social History  . Marital status: Married    Spouse name: N/A  . Number of children: N/A  . Years of education: N/A   Occupational History  . Not on file.   Social History Main Topics  . Smoking status: Former Smoker    Packs/day: 0.75    Types: Cigarettes    Quit date: 01/19/2016  . Smokeless tobacco: Never Used  . Alcohol use Yes     Comment: occasional  . Drug use: No  . Sexual activity: Not on file   Other Topics Concern  . 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   Married  2 children. 83 and 60 y/o.      Review of Systems  All other systems reviewed and are negative.      Objective:   Physical Exam  Cardiovascular: Normal rate, regular rhythm and normal heart sounds.   No murmur heard. Pulmonary/Chest: Effort normal and breath sounds normal. No respiratory distress. She has no wheezes. She has no rales.  Abdominal: Soft. Bowel sounds are normal. She exhibits no distension. There is no tenderness. There is no rebound and no guarding.  Musculoskeletal: She exhibits no edema.  Vitals reviewed.         Assessment & Plan:  Hypertriglyceridemia - Plan: CBC with  Differential/Platelet, COMPLETE METABOLIC PANEL WITH GFR, Lipid panel  GAD (generalized anxiety disorder)  Mood swings (HCC)  Depression, major, single episode, complete remission (HCC) Continue Lexapro 20 mg a day but augment with Lamictal 25 mg by mouth daily at bedtime for 2 weeks and then increase to 50 mg by mouth daily at bedtime thereafter hopefully help calm the mood swings. Anxiety and depression seem to be well controlled but the anger and mood swings suggest bipolar tendencies. Continue fenofibrate but repeat a CMP and fasting lipid panel

## 2016-09-29 ENCOUNTER — Encounter: Payer: Self-pay | Admitting: Family Medicine

## 2016-09-29 ENCOUNTER — Other Ambulatory Visit: Payer: Self-pay | Admitting: Family Medicine

## 2016-09-29 DIAGNOSIS — D72829 Elevated white blood cell count, unspecified: Secondary | ICD-10-CM

## 2016-10-30 ENCOUNTER — Other Ambulatory Visit: Payer: Self-pay | Admitting: Family Medicine

## 2016-10-30 DIAGNOSIS — Z1231 Encounter for screening mammogram for malignant neoplasm of breast: Secondary | ICD-10-CM

## 2016-11-01 ENCOUNTER — Other Ambulatory Visit: Payer: Managed Care, Other (non HMO)

## 2016-11-01 DIAGNOSIS — D72829 Elevated white blood cell count, unspecified: Secondary | ICD-10-CM

## 2016-11-02 ENCOUNTER — Encounter: Payer: Self-pay | Admitting: Family Medicine

## 2016-11-02 LAB — CBC WITH DIFFERENTIAL/PLATELET
Basophils Absolute: 77 cells/uL (ref 0–200)
Basophils Relative: 1 %
Eosinophils Absolute: 154 cells/uL (ref 15–500)
Eosinophils Relative: 2 %
HCT: 41.1 % (ref 35.0–45.0)
Hemoglobin: 13.4 g/dL (ref 12.0–15.0)
Lymphocytes Relative: 22 %
Lymphs Abs: 1694 cells/uL (ref 850–3900)
MCH: 33.1 pg — ABNORMAL HIGH (ref 27.0–33.0)
MCHC: 32.6 g/dL (ref 32.0–36.0)
MCV: 101.5 fL — ABNORMAL HIGH (ref 80.0–100.0)
MPV: 10.5 fL (ref 7.5–12.5)
Monocytes Absolute: 693 cells/uL (ref 200–950)
Monocytes Relative: 9 %
Neutro Abs: 5082 cells/uL (ref 1500–7800)
Neutrophils Relative %: 66 %
Platelets: 300 10*3/uL (ref 140–400)
RBC: 4.05 MIL/uL (ref 3.80–5.10)
RDW: 12.9 % (ref 11.0–15.0)
WBC: 7.7 10*3/uL (ref 3.8–10.8)

## 2016-11-15 ENCOUNTER — Other Ambulatory Visit: Payer: Self-pay | Admitting: Family Medicine

## 2016-12-18 ENCOUNTER — Ambulatory Visit: Payer: Managed Care, Other (non HMO)

## 2016-12-26 ENCOUNTER — Encounter: Payer: Self-pay | Admitting: Family Medicine

## 2016-12-26 ENCOUNTER — Telehealth: Payer: Self-pay | Admitting: Family Medicine

## 2016-12-26 NOTE — Telephone Encounter (Signed)
Called and spoke to pt and informed to go on [facility] and meds are cheap enough for pt to afford. Will will email me to let me know which ones she would like to have [facility].

## 2016-12-26 NOTE — Telephone Encounter (Signed)
Pt called saying her husband has lost his job and she cannot afford the lamotrigine oop cost. Is there anything else we can switch her to?

## 2016-12-27 MED ORDER — ESCITALOPRAM OXALATE 20 MG PO TABS: 20.0000 mg | ORAL_TABLET | Freq: Every day | ORAL | 3 refills | Status: DC

## 2016-12-27 MED ORDER — FENOFIBRATE 160 MG PO TABS
160.0000 mg | ORAL_TABLET | Freq: Every day | ORAL | 3 refills | Status: DC
Start: 2016-12-27 — End: 2017-12-07

## 2016-12-27 MED ORDER — LAMOTRIGINE 25 MG PO TABS: 50.0000 mg | ORAL_TABLET | Freq: Every day | ORAL | 3 refills | Status: DC

## 2017-01-25 ENCOUNTER — Encounter: Payer: Self-pay | Admitting: Family Medicine

## 2017-02-02 ENCOUNTER — Other Ambulatory Visit: Payer: Self-pay | Admitting: Family Medicine

## 2017-11-03 ENCOUNTER — Encounter (HOSPITAL_COMMUNITY): Payer: Self-pay | Admitting: Emergency Medicine

## 2017-11-03 ENCOUNTER — Emergency Department (HOSPITAL_COMMUNITY): Payer: No Typology Code available for payment source

## 2017-11-03 ENCOUNTER — Emergency Department (HOSPITAL_COMMUNITY)
Admission: EM | Admit: 2017-11-03 | Discharge: 2017-11-03 | Disposition: A | Discharge status: Home / Self Care | Payer: No Typology Code available for payment source | Attending: Emergency Medicine | Admitting: Emergency Medicine

## 2017-11-03 DIAGNOSIS — Z79899 Other long term (current) drug therapy: Secondary | ICD-10-CM | POA: Diagnosis not present

## 2017-11-03 DIAGNOSIS — F17210 Nicotine dependence, cigarettes, uncomplicated: Secondary | ICD-10-CM | POA: Diagnosis not present

## 2017-11-03 DIAGNOSIS — S161XXA Strain of muscle, fascia and tendon at neck level, initial encounter: Secondary | ICD-10-CM

## 2017-11-03 DIAGNOSIS — H538 Other visual disturbances: Secondary | ICD-10-CM | POA: Insufficient documentation

## 2017-11-03 DIAGNOSIS — Y9389 Activity, other specified: Secondary | ICD-10-CM | POA: Insufficient documentation

## 2017-11-03 DIAGNOSIS — I509 Heart failure, unspecified: Secondary | ICD-10-CM | POA: Insufficient documentation

## 2017-11-03 DIAGNOSIS — R51 Headache: Secondary | ICD-10-CM | POA: Diagnosis not present

## 2017-11-03 DIAGNOSIS — Y92410 Unspecified street and highway as the place of occurrence of the external cause: Secondary | ICD-10-CM | POA: Insufficient documentation

## 2017-11-03 DIAGNOSIS — Y999 Unspecified external cause status: Secondary | ICD-10-CM | POA: Diagnosis not present

## 2017-11-03 DIAGNOSIS — S199XXA Unspecified injury of neck, initial encounter: Secondary | ICD-10-CM | POA: Diagnosis present

## 2017-11-03 DIAGNOSIS — R0789 Other chest pain: Secondary | ICD-10-CM | POA: Diagnosis not present

## 2017-11-03 MED ORDER — ACETAMINOPHEN 500 MG PO TABS
1000.0000 mg | ORAL_TABLET | Freq: Once | ORAL | Status: AC
  Administered 2017-11-03: 1000 mg via ORAL
  Filled 2017-11-03: qty 2

## 2017-11-03 NOTE — Discharge Instructions (Addendum)

## 2017-11-03 NOTE — ED Notes (Signed)
Pt declined hallway bed to be seen faster when asked at this time. Pt states that she prefers waiting for a room.
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
## 2017-12-07 ENCOUNTER — Other Ambulatory Visit: Payer: Self-pay | Admitting: Family Medicine

## 2018-02-06 ENCOUNTER — Other Ambulatory Visit: Payer: Self-pay | Admitting: Family Medicine

## 2018-02-13 ENCOUNTER — Other Ambulatory Visit: Payer: Self-pay | Admitting: Family Medicine

## 2018-02-13 DIAGNOSIS — Z1231 Encounter for screening mammogram for malignant neoplasm of breast: Secondary | ICD-10-CM

## 2018-03-01 ENCOUNTER — Encounter: Payer: Self-pay | Admitting: Family Medicine

## 2018-03-09 ENCOUNTER — Other Ambulatory Visit: Payer: Self-pay | Admitting: Family Medicine

## 2018-03-11 ENCOUNTER — Ambulatory Visit
Admission: RE | Admit: 2018-03-11 | Discharge: 2018-03-11 | Disposition: A | Discharge status: Home / Self Care | Payer: No Typology Code available for payment source | Source: Ambulatory Visit | Attending: Family Medicine | Admitting: Family Medicine

## 2018-03-11 DIAGNOSIS — Z1231 Encounter for screening mammogram for malignant neoplasm of breast: Secondary | ICD-10-CM

## 2018-03-13 ENCOUNTER — Other Ambulatory Visit: Payer: Self-pay | Admitting: Family Medicine

## 2018-03-13 DIAGNOSIS — R921 Mammographic calcification found on diagnostic imaging of breast: Secondary | ICD-10-CM

## 2018-03-15 ENCOUNTER — Ambulatory Visit
Admission: RE | Admit: 2018-03-15 | Discharge: 2018-03-15 | Disposition: A | Discharge status: Home / Self Care | Payer: No Typology Code available for payment source | Source: Ambulatory Visit | Attending: Family Medicine | Admitting: Family Medicine

## 2018-03-15 ENCOUNTER — Other Ambulatory Visit: Payer: Self-pay | Admitting: Family Medicine

## 2018-03-15 DIAGNOSIS — R921 Mammographic calcification found on diagnostic imaging of breast: Secondary | ICD-10-CM

## 2018-03-18 ENCOUNTER — Other Ambulatory Visit: Payer: Self-pay | Admitting: Family Medicine

## 2018-03-18 ENCOUNTER — Encounter: Payer: Self-pay | Admitting: Family Medicine

## 2018-03-18 MED ORDER — DIAZEPAM 10 MG PO TABS: 10.0000 mg | ORAL_TABLET | Freq: Two times a day (BID) | ORAL | 0 refills | Status: DC | PRN

## 2018-03-21 ENCOUNTER — Ambulatory Visit
Admission: RE | Admit: 2018-03-21 | Discharge: 2018-03-21 | Disposition: A | Discharge status: Home / Self Care | Payer: No Typology Code available for payment source | Source: Ambulatory Visit | Attending: Family Medicine | Admitting: Family Medicine

## 2018-03-21 ENCOUNTER — Encounter (HOSPITAL_COMMUNITY): Payer: Self-pay

## 2018-03-21 ENCOUNTER — Ambulatory Visit (HOSPITAL_COMMUNITY)
Admission: RE | Admit: 2018-03-21 | Discharge: 2018-03-21 | Disposition: A | Discharge status: Home / Self Care | Payer: No Typology Code available for payment source | Source: Ambulatory Visit | Attending: Obstetrics and Gynecology | Admitting: Obstetrics and Gynecology

## 2018-03-21 VITALS — BP 116/74 | Ht 65.0 in | Wt 177.0 lb

## 2018-03-21 DIAGNOSIS — Z1239 Encounter for other screening for malignant neoplasm of breast: Secondary | ICD-10-CM

## 2018-03-21 DIAGNOSIS — R921 Mammographic calcification found on diagnostic imaging of breast: Secondary | ICD-10-CM

## 2018-03-21 HISTORY — PX: BREAST BIOPSY: SHX20

## 2018-03-21 NOTE — Patient Instructions (Signed)
Explained breast self awareness with Lisa Gates. Patient did not need a Pap smear today due to last Pap smear and HPV typing was 11/27/2013. Let her know BCCCP will cover Pap smears and HPV typing every 5 years unless has a history of abnormal Pap smears. Referred patient to the [facility] for a left breast biopsy per recommendation. Appointment scheduled for Thursday, March 21, 2018 at 1100. Patient aware of appointment and will be there. Discussed smoking cessation with patient. Referred to the [facility] Quitline and gave resources to the free smoking cessation classes at [facility]. Lisa Gates verbalized understanding.  Lisa Gates, Kathaleen Maser, RN 8:06 AM

## 2018-03-21 NOTE — Progress Notes (Signed)
Patient referred to [facility] by the [facility] due to recommending a left breast biopsy. Patient had a screening mammogram completed 03/11/2018 that additional imaging of the left breast was recommended and a left breast diagnostic mammogram completed 03/15/2018 that a left breast biopsy is recommended.  Pap Smear: Pap smear not completed today. Last Pap smear was 11/27/2013 by Allayne Butcher, PA and normal with negative HPV. Per patient has no history of an abnormal Pap smear. Last Pap smear result is in Epic.  Physical exam: Breasts Breasts symmetrical. No skin abnormalities bilateral breasts. No nipple retraction bilateral breasts. No nipple discharge bilateral breasts. No lymphadenopathy. No lumps palpated bilateral breasts. No complaints of pain or tenderness on exam. Referred patient to the [facility] for a left breast biopsy per recommendation. Appointment scheduled for Thursday, March 21, 2018 at 1100.        Pelvic/Bimanual No Pap smear completed today since last Pap smear and HPV typing was 11/27/2013. Pap smear not indicated per BCCCP guidelines.   Smoking History: Patient is a current smoker. Discussed smoking cessation with patient. Referred to the [facility] Quitline and gave resources to the free smoking cessation classes at [facility].  Patient Navigation: Patient education provided. Access to services provided for patient through BCCCP program.   Breast and Cervical Cancer Risk Assessment: Patient has no family history of breast cancer, known genetic mutations, or radiation treatment to the chest before age 39. Patient has no history of cervical dysplasia, immunocompromised, or DES exposure in-utero.  Risk Assessment    Risk Scores      03/21/2018   Last edited by: Priscille Heidelberg, RN   5-year risk: 0.6 %   Lifetime risk: 6.1 %

## 2018-03-29 ENCOUNTER — Encounter (HOSPITAL_COMMUNITY): Payer: Self-pay | Admitting: *Deleted

## 2018-04-04 ENCOUNTER — Other Ambulatory Visit: Payer: Self-pay | Admitting: General Surgery

## 2018-04-04 DIAGNOSIS — N6092 Unspecified benign mammary dysplasia of left breast: Secondary | ICD-10-CM

## 2018-04-11 ENCOUNTER — Other Ambulatory Visit: Payer: Self-pay | Admitting: Family Medicine

## 2018-04-15 ENCOUNTER — Encounter (HOSPITAL_BASED_OUTPATIENT_CLINIC_OR_DEPARTMENT_OTHER): Payer: Self-pay

## 2018-04-15 ENCOUNTER — Telehealth (HOSPITAL_COMMUNITY): Payer: Self-pay | Admitting: *Deleted

## 2018-04-15 ENCOUNTER — Other Ambulatory Visit: Payer: Self-pay

## 2018-04-15 NOTE — Telephone Encounter (Signed)
Telephoned patient at home number. Patient will come Wednesday November 20 3:30 to [facility].

## 2018-04-17 ENCOUNTER — Encounter (HOSPITAL_BASED_OUTPATIENT_CLINIC_OR_DEPARTMENT_OTHER)
Admission: RE | Admit: 2018-04-17 | Discharge: 2018-04-17 | Disposition: A | Discharge status: Home / Self Care | Payer: Self-pay | Source: Ambulatory Visit | Attending: General Surgery | Admitting: General Surgery

## 2018-04-17 DIAGNOSIS — F17210 Nicotine dependence, cigarettes, uncomplicated: Secondary | ICD-10-CM | POA: Diagnosis not present

## 2018-04-17 DIAGNOSIS — D649 Anemia, unspecified: Secondary | ICD-10-CM | POA: Diagnosis not present

## 2018-04-17 DIAGNOSIS — Z79899 Other long term (current) drug therapy: Secondary | ICD-10-CM | POA: Diagnosis not present

## 2018-04-17 DIAGNOSIS — N6092 Unspecified benign mammary dysplasia of left breast: Secondary | ICD-10-CM | POA: Diagnosis not present

## 2018-04-17 LAB — CBC WITH DIFFERENTIAL/PLATELET
Abs Immature Granulocytes: 0.07 10*3/uL (ref 0.00–0.07)
Basophils Absolute: 0.1 10*3/uL (ref 0.0–0.1)
Basophils Relative: 1 %
Eosinophils Absolute: 0.1 10*3/uL (ref 0.0–0.5)
Eosinophils Relative: 2 %
HCT: 39.7 % (ref 36.0–46.0)
Hemoglobin: 12.5 g/dL (ref 12.0–15.0)
Immature Granulocytes: 1 %
Lymphocytes Relative: 26 %
Lymphs Abs: 1.6 10*3/uL (ref 0.7–4.0)
MCH: 31.6 pg (ref 26.0–34.0)
MCHC: 31.5 g/dL (ref 30.0–36.0)
MCV: 100.5 fL — ABNORMAL HIGH (ref 80.0–100.0)
Monocytes Absolute: 0.7 10*3/uL (ref 0.1–1.0)
Monocytes Relative: 11 %
Neutro Abs: 3.7 10*3/uL (ref 1.7–7.7)
Neutrophils Relative %: 59 %
Platelets: 326 10*3/uL (ref 150–400)
RBC: 3.95 MIL/uL (ref 3.87–5.11)
RDW: 12.2 % (ref 11.5–15.5)
WBC: 6.3 10*3/uL (ref 4.0–10.5)
nRBC: 0 % (ref 0.0–0.2)

## 2018-04-17 LAB — COMPREHENSIVE METABOLIC PANEL
ALT: 23 U/L (ref 0–44)
AST: 31 U/L (ref 15–41)
Albumin: 4.1 g/dL (ref 3.5–5.0)
Alkaline Phosphatase: 70 U/L (ref 38–126)
Anion gap: 8 (ref 5–15)
BUN: 12 mg/dL (ref 6–20)
CO2: 23 mmol/L (ref 22–32)
Calcium: 9.8 mg/dL (ref 8.9–10.3)
Chloride: 108 mmol/L (ref 98–111)
Creatinine, Ser: 0.67 mg/dL (ref 0.44–1.00)
GFR calc Af Amer: >60 mL/min (ref >60)
GFR calc non Af Amer: >60 mL/min (ref >60)
Glucose, Bld: 85 mg/dL (ref 70–99)
Potassium: 4.2 mmol/L (ref 3.5–5.1)
Sodium: 139 mmol/L (ref 135–145)
Total Bilirubin: 0.6 mg/dL (ref 0.3–1.2)
Total Protein: 6.9 g/dL (ref 6.5–8.1)

## 2018-04-17 NOTE — Progress Notes (Signed)
Ensure pre surgery drink given with instructions to complete by 0415 dos, pt verbalized understanding. 
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
## 2018-04-23 ENCOUNTER — Encounter (HOSPITAL_BASED_OUTPATIENT_CLINIC_OR_DEPARTMENT_OTHER): Payer: Self-pay | Admitting: Anesthesiology

## 2018-04-23 ENCOUNTER — Ambulatory Visit
Admission: RE | Admit: 2018-04-23 | Discharge: 2018-04-23 | Disposition: A | Discharge status: Home / Self Care | Payer: No Typology Code available for payment source | Source: Ambulatory Visit | Attending: General Surgery | Admitting: General Surgery

## 2018-04-23 DIAGNOSIS — N6092 Unspecified benign mammary dysplasia of left breast: Secondary | ICD-10-CM

## 2018-04-23 NOTE — Anesthesia Preprocedure Evaluation (Addendum)
Anesthesia Evaluation  Patient identified by MRN, date of birth, ID band Patient awake    Reviewed: Allergy & Precautions, NPO status , Patient's Chart, lab work & pertinent test results  Airway Mallampati: II  TM Distance: >3 FB Neck ROM: Full    Dental no notable dental hx. (+) Teeth Intact   Pulmonary Current Smoker,    Pulmonary exam normal breath sounds clear to auscultation       Cardiovascular +CHF  Normal cardiovascular exam Rhythm:Regular Rate:Normal  Hx/o Peripartum CM age 52  EKG 04/17/2018 - NSR, Normal EKG  Echo 06/07/2012-Left ventricle: The cavity size was normal. Wall thicknesswas increased in a pattern of mild LVH. Systolic functionwas mildly reduced. The estimated ejection fraction was in the range of 45% to 50%. Left ventricular diastolicfunction parameters were normal for the patient's age with some features suggesting grade 1 diastolic dysfunction. - Regional wall motion abnormality: Cannot exclude mild hypokinesis of the basal-mid anteroseptal myocardium. - Mitral valve: Calcified annulus. Normal thickness leaflets . - Atrial septum: No defect or patent foramen ovale was identified.   Neuro/Psych PSYCHIATRIC DISORDERS Depression Abnormal NCV left arm    GI/Hepatic negative GI ROS, Neg liver ROS,   Endo/Other  Hyperlipidemia Atypical ductal hyperplasia left breast  Renal/GU negative Renal ROS  negative genitourinary   Musculoskeletal negative musculoskeletal ROS (+)   Abdominal (+) + obese,   Peds  Hematology  (+) anemia ,   Anesthesia Other Findings   Reproductive/Obstetrics                            Anesthesia Physical Anesthesia Plan  ASA: II  Anesthesia Plan: General   Post-op Pain Management:    Induction: Intravenous  PONV Risk Score and Plan: 3 and Ondansetron, Dexamethasone, Treatment may vary due to age or medical condition and  Midazolam  Airway Management Planned: LMA  Additional Equipment:   Intra-op Plan:   Post-operative Plan: Extubation in OR  Informed Consent: I have reviewed the patients History and Physical, chart, labs and discussed the procedure including the risks, benefits and alternatives for the proposed anesthesia with the patient or authorized representative who has indicated his/her understanding and acceptance.   Dental advisory given  Plan Discussed with: CRNA and Surgeon  Anesthesia Plan Comments:        Anesthesia Quick Evaluation

## 2018-04-24 ENCOUNTER — Ambulatory Visit
Admission: RE | Admit: 2018-04-24 | Discharge: 2018-04-24 | Disposition: A | Discharge status: Home / Self Care | Payer: No Typology Code available for payment source | Source: Ambulatory Visit | Attending: General Surgery | Admitting: General Surgery

## 2018-04-24 ENCOUNTER — Encounter (HOSPITAL_BASED_OUTPATIENT_CLINIC_OR_DEPARTMENT_OTHER): Payer: Self-pay | Admitting: Emergency Medicine

## 2018-04-24 ENCOUNTER — Ambulatory Visit (HOSPITAL_BASED_OUTPATIENT_CLINIC_OR_DEPARTMENT_OTHER)
Admission: RE | Admit: 2018-04-24 | Discharge: 2018-04-24 | Disposition: A | Discharge status: Home / Self Care | Payer: Medicaid Other | Source: Ambulatory Visit | Attending: General Surgery | Admitting: General Surgery

## 2018-04-24 ENCOUNTER — Ambulatory Visit (HOSPITAL_BASED_OUTPATIENT_CLINIC_OR_DEPARTMENT_OTHER): Payer: Medicaid Other | Admitting: Anesthesiology

## 2018-04-24 ENCOUNTER — Encounter (HOSPITAL_BASED_OUTPATIENT_CLINIC_OR_DEPARTMENT_OTHER)
Admission: RE | Disposition: A | Discharge status: Home / Self Care | Payer: Self-pay | Source: Ambulatory Visit | Attending: General Surgery

## 2018-04-24 ENCOUNTER — Other Ambulatory Visit: Payer: Self-pay

## 2018-04-24 DIAGNOSIS — N6092 Unspecified benign mammary dysplasia of left breast: Secondary | ICD-10-CM | POA: Diagnosis not present

## 2018-04-24 DIAGNOSIS — D649 Anemia, unspecified: Secondary | ICD-10-CM | POA: Insufficient documentation

## 2018-04-24 DIAGNOSIS — F17210 Nicotine dependence, cigarettes, uncomplicated: Secondary | ICD-10-CM | POA: Diagnosis not present

## 2018-04-24 DIAGNOSIS — Z79899 Other long term (current) drug therapy: Secondary | ICD-10-CM | POA: Insufficient documentation

## 2018-04-24 HISTORY — PX: BREAST LUMPECTOMY WITH RADIOACTIVE SEED LOCALIZATION: SHX6424

## 2018-04-24 HISTORY — PX: BREAST EXCISIONAL BIOPSY: SUR124

## 2018-04-24 HISTORY — DX: Unspecified benign mammary dysplasia of left breast: N60.92

## 2018-04-24 SURGERY — BREAST LUMPECTOMY WITH RADIOACTIVE SEED LOCALIZATION
Anesthesia: General | Site: Breast | Laterality: Left

## 2018-04-24 MED ORDER — CHLORHEXIDINE GLUCONATE CLOTH 2 % EX PADS: 6.0000 | MEDICATED_PAD | Freq: Once | CUTANEOUS | Status: DC

## 2018-04-24 MED ORDER — BUPIVACAINE-EPINEPHRINE (PF) 0.25% -1:200000 IJ SOLN
INTRAMUSCULAR | Status: AC
  Filled 2018-04-24: qty 60

## 2018-04-24 MED ORDER — HYDROCODONE-ACETAMINOPHEN 5-325 MG PO TABS: 1.0000 | ORAL_TABLET | Freq: Four times a day (QID) | ORAL | 0 refills | Status: DC | PRN

## 2018-04-24 MED ORDER — FENTANYL CITRATE (PF) 250 MCG/5ML IJ SOLN
INTRAMUSCULAR | Status: DC | PRN
  Administered 2018-04-24 (×4): 25 ug via INTRAVENOUS

## 2018-04-24 MED ORDER — MIDAZOLAM HCL 2 MG/2ML IJ SOLN
INTRAMUSCULAR | Status: AC
  Filled 2018-04-24: qty 2

## 2018-04-24 MED ORDER — ONDANSETRON HCL 4 MG/2ML IJ SOLN
INTRAMUSCULAR | Status: DC | PRN
  Administered 2018-04-24: 4 mg via INTRAVENOUS

## 2018-04-24 MED ORDER — CEFAZOLIN SODIUM-DEXTROSE 2-4 GM/100ML-% IV SOLN
INTRAVENOUS | Status: AC
  Filled 2018-04-24: qty 100

## 2018-04-24 MED ORDER — METOCLOPRAMIDE HCL 5 MG/ML IJ SOLN
10.0000 mg | Freq: Once | INTRAMUSCULAR | Status: AC | PRN
  Administered 2018-04-24: 10 mg via INTRAVENOUS

## 2018-04-24 MED ORDER — MEPERIDINE HCL 25 MG/ML IJ SOLN: 6.2500 mg | INTRAMUSCULAR | Status: DC | PRN

## 2018-04-24 MED ORDER — CELECOXIB 200 MG PO CAPS
200.0000 mg | ORAL_CAPSULE | ORAL | Status: AC
  Administered 2018-04-24: 200 mg via ORAL

## 2018-04-24 MED ORDER — HYDROCODONE-ACETAMINOPHEN 7.5-325 MG PO TABS: 1.0000 | ORAL_TABLET | Freq: Once | ORAL | Status: DC | PRN

## 2018-04-24 MED ORDER — MIDAZOLAM HCL 5 MG/5ML IJ SOLN
INTRAMUSCULAR | Status: DC | PRN
  Administered 2018-04-24: 2 mg via INTRAVENOUS

## 2018-04-24 MED ORDER — METOCLOPRAMIDE HCL 5 MG/ML IJ SOLN
INTRAMUSCULAR | Status: AC
  Filled 2018-04-24: qty 2

## 2018-04-24 MED ORDER — FENTANYL CITRATE (PF) 100 MCG/2ML IJ SOLN: 50.0000 ug | INTRAMUSCULAR | Status: DC | PRN

## 2018-04-24 MED ORDER — CEFAZOLIN SODIUM-DEXTROSE 2-4 GM/100ML-% IV SOLN
2.0000 g | INTRAVENOUS | Status: AC
  Administered 2018-04-24: 2 g via INTRAVENOUS

## 2018-04-24 MED ORDER — PROPOFOL 10 MG/ML IV BOLUS
INTRAVENOUS | Status: DC | PRN
  Administered 2018-04-24: 160 mg via INTRAVENOUS

## 2018-04-24 MED ORDER — MIDAZOLAM HCL 2 MG/2ML IJ SOLN: 1.0000 mg | INTRAMUSCULAR | Status: DC | PRN

## 2018-04-24 MED ORDER — LACTATED RINGERS IV SOLN
INTRAVENOUS | Status: DC
  Administered 2018-04-24 (×2): via INTRAVENOUS

## 2018-04-24 MED ORDER — ACETAMINOPHEN 500 MG PO TABS
ORAL_TABLET | ORAL | Status: AC
  Filled 2018-04-24: qty 2

## 2018-04-24 MED ORDER — LIDOCAINE 2% (20 MG/ML) 5 ML SYRINGE
INTRAMUSCULAR | Status: DC | PRN
  Administered 2018-04-24: 60 mg via INTRAVENOUS

## 2018-04-24 MED ORDER — GABAPENTIN 300 MG PO CAPS
ORAL_CAPSULE | ORAL | Status: AC
  Filled 2018-04-24: qty 1

## 2018-04-24 MED ORDER — PROPOFOL 10 MG/ML IV BOLUS
INTRAVENOUS | Status: AC
  Filled 2018-04-24: qty 20

## 2018-04-24 MED ORDER — DEXAMETHASONE SODIUM PHOSPHATE 10 MG/ML IJ SOLN
INTRAMUSCULAR | Status: DC | PRN
  Administered 2018-04-24: 10 mg via INTRAVENOUS

## 2018-04-24 MED ORDER — CELECOXIB 200 MG PO CAPS
ORAL_CAPSULE | ORAL | Status: AC
  Filled 2018-04-24: qty 1

## 2018-04-24 MED ORDER — FENTANYL CITRATE (PF) 100 MCG/2ML IJ SOLN: 25.0000 ug | INTRAMUSCULAR | Status: DC | PRN

## 2018-04-24 MED ORDER — ACETAMINOPHEN 500 MG PO TABS
1000.0000 mg | ORAL_TABLET | ORAL | Status: AC
  Administered 2018-04-24: 1000 mg via ORAL

## 2018-04-24 MED ORDER — FENTANYL CITRATE (PF) 100 MCG/2ML IJ SOLN
INTRAMUSCULAR | Status: AC
  Filled 2018-04-24: qty 2

## 2018-04-24 MED ORDER — GABAPENTIN 300 MG PO CAPS
300.0000 mg | ORAL_CAPSULE | ORAL | Status: AC
  Administered 2018-04-24: 300 mg via ORAL

## 2018-04-24 MED ORDER — SCOPOLAMINE 1 MG/3DAYS TD PT72: 1.0000 | MEDICATED_PATCH | Freq: Once | TRANSDERMAL | Status: DC | PRN

## 2018-04-24 SURGICAL SUPPLY — 68 items
ADH SKN CLS APL DERMABOND .7 (GAUZE/BANDAGES/DRESSINGS) ×1
APL SKNCLS STERI-STRIP NONHPOA (GAUZE/BANDAGES/DRESSINGS)
APPLIER CLIP 9.375 MED OPEN (MISCELLANEOUS)
APR CLP MED 9.3 20 MLT OPN (MISCELLANEOUS)
BENZOIN TINCTURE PRP APPL 2/3 (GAUZE/BANDAGES/DRESSINGS) IMPLANT
BINDER BREAST LRG (GAUZE/BANDAGES/DRESSINGS) IMPLANT
BINDER BREAST MEDIUM (GAUZE/BANDAGES/DRESSINGS) IMPLANT
BINDER BREAST XLRG (GAUZE/BANDAGES/DRESSINGS) IMPLANT
BINDER BREAST XXLRG (GAUZE/BANDAGES/DRESSINGS) IMPLANT
BLADE HEX COATED 2.75 (ELECTRODE) ×3 IMPLANT
BLADE SURG 10 STRL SS (BLADE) IMPLANT
BLADE SURG 15 STRL LF DISP TIS (BLADE) ×1 IMPLANT
BLADE SURG 15 STRL SS (BLADE) ×3
CANISTER SUC SOCK COL 7IN (MISCELLANEOUS) IMPLANT
CANISTER SUCT 1200ML W/VALVE (MISCELLANEOUS) ×3 IMPLANT
CHLORAPREP W/TINT 26ML (MISCELLANEOUS) ×3 IMPLANT
CLIP APPLIE 9.375 MED OPEN (MISCELLANEOUS) IMPLANT
CLOSURE WOUND 1/2 X4 (GAUZE/BANDAGES/DRESSINGS)
COVER BACK TABLE 60X90IN (DRAPES) ×3 IMPLANT
COVER MAYO STAND STRL (DRAPES) ×3 IMPLANT
COVER PROBE W GEL 5X96 (DRAPES) ×3 IMPLANT
COVER WAND RF STERILE (DRAPES) IMPLANT
DECANTER SPIKE VIAL GLASS SM (MISCELLANEOUS) IMPLANT
DERMABOND ADVANCED (GAUZE/BANDAGES/DRESSINGS) ×2
DERMABOND ADVANCED .7 DNX12 (GAUZE/BANDAGES/DRESSINGS) ×1 IMPLANT
DEVICE DUBIN W/COMP PLATE 8390 (MISCELLANEOUS) ×3 IMPLANT
DRAPE LAPAROSCOPIC ABDOMINAL (DRAPES) ×3 IMPLANT
DRAPE UTILITY XL STRL (DRAPES) ×3 IMPLANT
DRSG PAD ABDOMINAL 8X10 ST (GAUZE/BANDAGES/DRESSINGS) ×3 IMPLANT
ELECT REM PT RETURN 9FT ADLT (ELECTROSURGICAL) ×3
ELECTRODE REM PT RTRN 9FT ADLT (ELECTROSURGICAL) ×1 IMPLANT
GAUZE SPONGE 4X4 12PLY STRL LF (GAUZE/BANDAGES/DRESSINGS) ×3 IMPLANT
GLOVE BIO SURGEON STRL SZ 6.5 (GLOVE) ×1 IMPLANT
GLOVE BIO SURGEONS STRL SZ 6.5 (GLOVE) ×1
GLOVE BIOGEL PI IND STRL 7.0 (GLOVE) IMPLANT
GLOVE BIOGEL PI INDICATOR 7.0 (GLOVE) ×4
GLOVE EUDERMIC 7 POWDERFREE (GLOVE) ×3 IMPLANT
GOWN STRL REUS W/ TWL LRG LVL3 (GOWN DISPOSABLE) ×1 IMPLANT
GOWN STRL REUS W/ TWL XL LVL3 (GOWN DISPOSABLE) ×1 IMPLANT
GOWN STRL REUS W/TWL LRG LVL3 (GOWN DISPOSABLE) ×3
GOWN STRL REUS W/TWL XL LVL3 (GOWN DISPOSABLE) ×3
ILLUMINATOR WAVEGUIDE N/F (MISCELLANEOUS) IMPLANT
KIT MARKER MARGIN INK (KITS) ×3 IMPLANT
LIGHT WAVEGUIDE WIDE FLAT (MISCELLANEOUS) IMPLANT
NDL HYPO 25X1 1.5 SAFETY (NEEDLE) ×1 IMPLANT
NEEDLE HYPO 25X1 1.5 SAFETY (NEEDLE) ×3 IMPLANT
NS IRRIG 1000ML POUR BTL (IV SOLUTION) ×3 IMPLANT
PACK BASIN DAY SURGERY FS (CUSTOM PROCEDURE TRAY) ×3 IMPLANT
PENCIL BUTTON HOLSTER BLD 10FT (ELECTRODE) ×3 IMPLANT
SHEET MEDIUM DRAPE 40X70 STRL (DRAPES) IMPLANT
SLEEVE SCD COMPRESS KNEE MED (MISCELLANEOUS) ×3 IMPLANT
SPONGE LAP 18X18 RF (DISPOSABLE) ×3 IMPLANT
SPONGE LAP 4X18 RFD (DISPOSABLE) ×3 IMPLANT
STRIP CLOSURE SKIN 1/2X4 (GAUZE/BANDAGES/DRESSINGS) IMPLANT
SUT ETHILON 3 0 FSL (SUTURE) IMPLANT
SUT MNCRL AB 4-0 PS2 18 (SUTURE) ×3 IMPLANT
SUT SILK 2 0 SH (SUTURE) ×3 IMPLANT
SUT VIC AB 2-0 CT1 27 (SUTURE)
SUT VIC AB 2-0 CT1 TAPERPNT 27 (SUTURE) IMPLANT
SUT VIC AB 3-0 SH 27 (SUTURE)
SUT VIC AB 3-0 SH 27X BRD (SUTURE) IMPLANT
SUT VICRYL 3-0 CR8 SH (SUTURE) ×3 IMPLANT
SYR 10ML LL (SYRINGE) ×3 IMPLANT
TOWEL GREEN STERILE FF (TOWEL DISPOSABLE) ×3 IMPLANT
TOWEL OR NON WOVEN STRL DISP B (DISPOSABLE) IMPLANT
TUBE CONNECTING 20'X1/4 (TUBING) ×1
TUBE CONNECTING 20X1/4 (TUBING) ×2 IMPLANT
YANKAUER SUCT BULB TIP NO VENT (SUCTIONS) ×3 IMPLANT

## 2018-04-24 NOTE — Anesthesia Procedure Notes (Signed)
Procedure Name: LMA Insertion Date/Time: 04/24/2018 7:29 AM Performed by: Lucinda Dellecarlo, Briawna Carver M, CRNA Pre-anesthesia Checklist: Patient identified, Emergency Drugs available, Suction available and Patient being monitored Patient Re-evaluated:Patient Re-evaluated prior to induction Oxygen Delivery Method: Circle system utilized Preoxygenation: Pre-oxygenation with 100% oxygen Induction Type: IV induction Ventilation: Mask ventilation without difficulty LMA: LMA inserted LMA Size: 4.0 Tube type: Oral Number of attempts: 1 Placement Confirmation: positive ETCO2 and breath sounds checked- equal and bilateral Tube secured with: Tape Dental Injury: Teeth and Oropharynx as per pre-operative assessment

## 2018-04-24 NOTE — Discharge Instructions (Signed)
[facility] °Office Phone Number [phone] ° °BREAST BIOPSY/ PARTIAL MASTECTOMY: POST OP INSTRUCTIONS ° °Always review your discharge instruction sheet given to you by the facility where your surgery was performed. ° °IF YOU HAVE DISABILITY OR FAMILY LEAVE FORMS, YOU MUST BRING THEM TO THE OFFICE FOR PROCESSING.  DO NOT GIVE THEM TO YOUR DOCTOR. ° °1. A prescription for pain medication may be given to you upon discharge.  Take your pain medication as prescribed, if needed.  If narcotic pain medicine is not needed, then you may take acetaminophen (Tylenol) or ibuprofen (Advil) as needed. °2. Take your usually prescribed medications unless otherwise directed °3. If you need a refill on your pain medication, please contact your pharmacy.  They will contact our office to request authorization.  Prescriptions will not be filled after 5pm or on week-ends. °4. You should eat very light the first 24 hours after surgery, such as soup, crackers, pudding, etc.  Resume your normal diet the day after surgery. °5. Most patients will experience some swelling and bruising in the breast.  Ice packs and a good support bra will help.  Swelling and bruising can take several days to resolve.  °6. It is common to experience some constipation if taking pain medication after surgery.  Increasing fluid intake and taking a stool softener will usually help or prevent this problem from occurring.  A mild laxative (Milk of Magnesia or Miralax) should be taken according to package directions if there are no bowel movements after 48 hours. °7. Unless discharge instructions indicate otherwise, you may remove your bandages 24-48 hours after surgery, and you may shower at that time.  You may have steri-strips (small skin tapes) in place directly over the incision.  These strips should be left on the skin for 7-10 days.  If your surgeon used skin glue on the incision, you may shower in 24 hours.  The glue will flake off over the  next 2-3 weeks.  Any sutures or staples will be removed at the office during your follow-up visit. °8. ACTIVITIES:  You may resume regular daily activities (gradually increasing) beginning the next day.  Wearing a good support bra or sports bra minimizes pain and swelling.  You may have sexual intercourse when it is comfortable. °a. You may drive when you no longer are taking prescription pain medication, you can comfortably wear a seatbelt, and you can safely maneuver your car and apply brakes. °b. RETURN TO WORK:  ______________________________________________________________________________________ °9. You should see your doctor in the office for a follow-up appointment approximately two weeks after your surgery.  Your doctor’s nurse will typically make your follow-up appointment when she calls you with your pathology report.  Expect your pathology report 2-3 business days after your surgery.  You may call to check if you do not hear from us after three days. °10. OTHER INSTRUCTIONS: _______________________________________________________________________________________________ _____________________________________________________________________________________________________________________________________ °_____________________________________________________________________________________________________________________________________ °_____________________________________________________________________________________________________________________________________ ° °WHEN TO CALL YOUR DOCTOR: °1. Fever over 101.0 °2. Nausea and/or vomiting. °3. Extreme swelling or bruising. °4. Continued bleeding from incision. °5. Increased pain, redness, or drainage from the incision. ° °The clinic staff is available to answer your questions during regular business hours.  Please don’t hesitate to call and ask to speak to one of the nurses for clinical concerns.  If you have a medical emergency, go to the nearest  emergency room or call 911.  A surgeon from [facility] is always on call at the hospital. ° °For further questions, please visit [email or web address]  ° ° ° ° °  Post Anesthesia Home Care Instructions ° °Activity: °Get plenty of rest for the remainder of the day. A responsible individual must stay with you for 24 hours following the procedure.  °For the next 24 hours, DO NOT: °-Drive a car °-Operate machinery °-Drink alcoholic beverages °-Take any medication unless instructed by your physician °-Make any legal decisions or sign important papers. ° °Meals: °Start with liquid foods such as gelatin or soup. Progress to regular foods as tolerated. Avoid greasy, spicy, heavy foods. If nausea and/or vomiting occur, drink only clear liquids until the nausea and/or vomiting subsides. Call your physician if vomiting continues. ° °Special Instructions/Symptoms: °Your throat may feel dry or sore from the anesthesia or the breathing tube placed in your throat during surgery. If this causes discomfort, gargle with warm salt water. The discomfort should disappear within 24 hours. ° °If you had a scopolamine patch placed behind your ear for the management of post- operative nausea and/or vomiting: ° °1. The medication in the patch is effective for 72 hours, after which it should be removed.  Wrap patch in a tissue and discard in the trash. Wash hands thoroughly with soap and water. °2. You may remove the patch earlier than 72 hours if you experience unpleasant side effects which may include dry mouth, dizziness or visual disturbances. °3. Avoid touching the patch. Wash your hands with soap and water after contact with the patch. °  ° °

## 2018-04-24 NOTE — Op Note (Signed)
Patient Name:           Lisa Gates   Date of Surgery:        04/24/2018  Pre op Diagnosis:      Atypical ductal hyperplasia left breast, upper outer quadrant  Post op Diagnosis:    Same  Procedure:                 Left breast lumpectomy with radioactive seed localization and margin assessment  Surgeon:                      M. , M.D., FACS  Assistant:                      OR staff  Operative Indications:   This is a 51-year-old female, referred by Lisa Gates at [facility] for evaluation of atypical ductal hyperplasia left breast, upper outer quadrant. Lisa Gates is her PCP. Her husband is present throughout the entirety of the encounter     She has no prior history of breast surgery. Get screening mammograms every 2-3 years. Recent imaging studies show a 10 mm area of suspicious calcifications in the left breast, upper outer quadrant, posterior depth. Category C density. Image guided biopsy shows atypical ductal hyperplasia with papillary features. 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 Family history is negative for breast or ovarian cancer.      I advised her that ADH is a high risk lesion and associated with 15-20% chance of cancer. I advised excision with radioactive seed localization and she agrees.  Operative Findings:       The radioactive seed and the original biopsy marker clip were immediately adjacent to each other in the far lateral posterior left breast.  I made a radially oriented incision.  Specimen mammogram looked very good with the seed in the center of the specimen.  Procedure in Detail:          Following the induction of general LMA anesthesia the patient's left breast was prepped and draped in a sterile fashion.  Intravenous antibiotics were given.  Surgical timeout was performed.  0.25% Marcaine with epinephrine was used as a local infiltration anesthetic.      Neoprobe was used to identify the seed.  A radially  oriented incision was made in the far lateral left breast.  Lumpectomy was performed with the neoprobe and electrocautery.  The specimen was removed and marked with silk sutures and a 6 color ink kit.  The specimen mammogram looked very good as described above.  The specimen was sent to the lab where the seed was retrieved.  The wound was irrigated.  Hemostasis was excellent.  5 metal marker clips were placed in the walls of the lumpectomy cavity.  Breast tissues were reapproximated with 3-0 Vicryl sutures and the skin closed with a running subcuticular 4-0 Monocryl and Dermabond.  Dry bandages and a breast binder were placed.  The patient tolerated the procedure well and was taken to PACU in stable condition.  EBL 10 cc.  Counts correct.  Complications none.    Addendum: I logged onto the [facility] website and reviewed her prescription medication history.      M. , M.D., FACS General and Minimally Invasive Surgery Breast and Colorectal Surgery  04/24/2018 8:10 AM  

## 2018-04-24 NOTE — Interval H&P Note (Signed)
History and Physical Interval Note:  04/24/2018 6:08 AM  Lisa ButtnerElizabeth W Kiang  has presented today for surgery, with the diagnosis of LEFT BREAST ATYPICAL DUCTAL HYPERPLASIA  The various methods of treatment have been discussed with the patient and family. After consideration of risks, benefits and other options for treatment, the patient has consented to  Procedure(s): LEFT BREAST LUMPECTOMY WITH RADIOACTIVE SEED LOCALIZATION (Left) as a surgical intervention .  The patient's history has been reviewed, patient examined, no change in status, stable for surgery.  I have reviewed the patient's chart and labs.  Questions were answered to the patient's satisfaction.     Ernestene MentionHaywood M Xadrian Craighead

## 2018-04-24 NOTE — Transfer of Care (Signed)
Immediate Anesthesia Transfer of Care Note  Patient: Lisa Gates  Procedure(s) Performed: LEFT BREAST LUMPECTOMY WITH RADIOACTIVE SEED LOCALIZATION (Left Breast)  Patient Location: PACU  Anesthesia Type:General  Level of Consciousness: awake, alert , oriented and patient cooperative  Airway & Oxygen Therapy: Patient Spontanous Breathing  Post-op Assessment: Report given to RN, Post -op Vital signs reviewed and stable and Patient moving all extremities  Post vital signs: Reviewed and stable  Last Vitals:  Vitals Value Taken Time  BP 134/74 04/24/2018  8:18 AM  Temp    Pulse 86 04/24/2018  8:18 AM  Resp    SpO2 93 % 04/24/2018  8:18 AM  Vitals shown include unvalidated device data.  Last Pain:  Vitals:   04/24/18 0635  TempSrc: Oral  PainSc: 0-No pain         Complications: No apparent anesthesia complications

## 2018-04-24 NOTE — Anesthesia Postprocedure Evaluation (Signed)
Anesthesia Post Note  Patient: Lisa Gates  Procedure(s) Performed: LEFT BREAST LUMPECTOMY WITH RADIOACTIVE SEED LOCALIZATION (Left Breast)     Patient location during evaluation: PACU Anesthesia Type: General Level of consciousness: awake and alert and oriented Pain management: pain level controlled Vital Signs Assessment: post-procedure vital signs reviewed and stable Respiratory status: spontaneous breathing, nonlabored ventilation and respiratory function stable Cardiovascular status: blood pressure returned to baseline and stable Postop Assessment: no apparent nausea or vomiting Anesthetic complications: no    Last Vitals:  Vitals:   04/24/18 0819 04/24/18 0830  BP:  115/76  Pulse: 84 77  Resp: 16 12  Temp: 36.6 C   SpO2: 96% 98%    Last Pain:  Vitals:   04/24/18 0819  TempSrc:   PainSc: 3                  Vanette Noguchi A.

## 2018-04-27 NOTE — Progress Notes (Signed)
Inform patient of Pathology report,. Breast pathology completely benign. No cancer. Will discuss in detail at next OV. Let me know you reached her.   Derrell LollingIngram

## 2018-04-29 ENCOUNTER — Encounter (HOSPITAL_BASED_OUTPATIENT_CLINIC_OR_DEPARTMENT_OTHER): Payer: Self-pay | Admitting: General Surgery

## 2018-05-13 ENCOUNTER — Encounter (HOSPITAL_COMMUNITY): Payer: Self-pay | Admitting: *Deleted

## 2018-05-20 ENCOUNTER — Other Ambulatory Visit: Payer: Self-pay | Admitting: Family Medicine

## 2018-06-03 ENCOUNTER — Encounter: Payer: Self-pay | Admitting: Family Medicine

## 2018-06-04 ENCOUNTER — Other Ambulatory Visit: Payer: Self-pay | Admitting: Family Medicine

## 2018-06-04 MED ORDER — CYCLOBENZAPRINE HCL 10 MG PO TABS: 10.0000 mg | ORAL_TABLET | Freq: Three times a day (TID) | ORAL | 0 refills | Status: DC

## 2018-06-13 ENCOUNTER — Other Ambulatory Visit: Payer: Self-pay | Admitting: Family Medicine

## 2018-07-24 ENCOUNTER — Other Ambulatory Visit: Payer: Self-pay | Admitting: Family Medicine

## 2018-08-21 ENCOUNTER — Other Ambulatory Visit: Payer: Self-pay | Admitting: Family Medicine

## 2018-09-24 ENCOUNTER — Other Ambulatory Visit: Payer: Self-pay | Admitting: Family Medicine

## 2018-09-30 ENCOUNTER — Encounter: Payer: Self-pay | Admitting: Family Medicine

## 2018-10-11 ENCOUNTER — Other Ambulatory Visit: Payer: Self-pay | Admitting: Family Medicine

## 2018-10-12 ENCOUNTER — Other Ambulatory Visit: Payer: Self-pay | Admitting: Family Medicine

## 2018-10-14 ENCOUNTER — Other Ambulatory Visit: Payer: Self-pay

## 2018-10-14 ENCOUNTER — Ambulatory Visit (INDEPENDENT_AMBULATORY_CARE_PROVIDER_SITE_OTHER): Payer: Managed Care, Other (non HMO) | Admitting: Family Medicine

## 2018-10-14 DIAGNOSIS — E781 Pure hyperglyceridemia: Secondary | ICD-10-CM

## 2018-10-14 DIAGNOSIS — F17200 Nicotine dependence, unspecified, uncomplicated: Secondary | ICD-10-CM

## 2018-10-14 DIAGNOSIS — F321 Major depressive disorder, single episode, moderate: Secondary | ICD-10-CM

## 2018-10-14 MED ORDER — BUPROPION HCL ER (XL) 150 MG PO TB24: 150.0000 mg | ORAL_TABLET | Freq: Every day | ORAL | 2 refills | Status: DC

## 2018-10-14 MED ORDER — FENOFIBRATE 160 MG PO TABS: 160.0000 mg | ORAL_TABLET | Freq: Every day | ORAL | 3 refills | Status: DC

## 2018-10-14 MED ORDER — ESCITALOPRAM OXALATE 20 MG PO TABS: 20.0000 mg | ORAL_TABLET | Freq: Every day | ORAL | 3 refills | Status: DC

## 2018-10-14 NOTE — Progress Notes (Signed)
Subjective:    Patient ID: Lisa Gates, female    DOB: 11-27-1965, 53 y.o.   MRN: [id number]  Medication Refill     2016 Patient is here today for follow-up of her hypertriglyceridemia as well as her iron deficiency anemia. She's been taking iron sulfate 325 mg by mouth 3 times a day. Her hemoglobin has risen from just greater than 10-13.9 since last year. She is also on fenofibrate 160 mg by mouth daily for hypertriglyceridemia. Her triglycerides have fallen from over 400 to under 150. Her LDL cholesterol is also acceptable.  At that time, my plan was:  Discontinue iron. Begin just a regular daily multivitamin with iron as anemia has improved. Furthermore her heavy bleeding has subsided. I will schedule the patient for regular screening mammogram. Cholesterol is excellent. I will make no changes in her fenofibrate. I did encourage her to quit smoking.  09/26/2016 Patient is in today for follow-up of her medical conditions. She is still taking fenofibrate 160 mg by mouth daily for hypertriglyceridemia. She denies any myalgias or right upper quadrant pain that she is due to recheck a fasting lipid panel and a CMP. She's been taking Lexapro 20 mg a day for anxiety and mood swings and depression. Her depression and anxiety is much better. However she continues to have severe anger outbursts and mood swings that are nonviolent particularly around the time of her menstrual cycles. She states that for 2-3 weeks every month she becomes "a psychotic b..th."  She loses her temper the drop of a hat. Her husband and her children state that she becomes enraged with very little provocation. She denies any hallucinations. She denies any suicidal ideation. She denies any homicidal ideation. She denies any delusions. She denies any insomnia. Mother may possibly have bipolar.  At that time, my plan was: Continue Lexapro 20 mg a day but augment with Lamictal 25 mg by mouth daily at bedtime for 2 weeks and then  increase to 50 mg by mouth daily at bedtime thereafter hopefully help calm the mood swings. Anxiety and depression seem to be well controlled but the anger and mood swings suggest bipolar tendencies. Continue fenofibrate but repeat a CMP and fasting lipid panel  10/14/18 Patient has not been seen in 2 years.  We asked her to schedule office visit so that we could discuss the management of her depression and also check lab work to monitor the treatment of her hypertriglyceridemia.  Patient is being seen today as a telephone visit.  Phone call began at 938.  Phone call concluded at 950.  Patient consents to be seen over the telephone.  She is currently at work.  I am currently in my office.  Patient states that the Lexapro is doing relatively well.  She is taking 20 mg a day.  However she is having breakthrough symptoms of depression.  She is working from home most days.  Her husband is also working from home.  There is stress in the marriage.  This is causing her to feel down and blue the majority of the time.  She states that she has very little optimism for the future.  She denies any suicidal thoughts however she is experiencing some anhedonia.  She denies any racing thoughts or flight of ideas.  She denies any suicidal ideation.  However she is requesting to try something stronger to manage her depression.  She is on Lamictal but she is only taking 25 mg a day and saw no  benefit since starting that.  She is also on fenofibrate 160 mg daily for hypertriglyceridemia.  She denies any myalgias or right upper quadrant pain. Past Medical History:  Diagnosis Date  . Abnormal nerve conduction studies 03/01/2013   left arm  . Anemia   . Atypical ductal hyperplasia of left breast 04/24/2018  . 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 BREAST LUMPECTOMY WITH RADIOACTIVE  SEED LOCALIZATION Left 04/24/2018   Procedure: LEFT BREAST LUMPECTOMY WITH RADIOACTIVE SEED LOCALIZATION;  Surgeon: Claud Kelp, MD;  Location: [facility];  Service: General;  Laterality: Left;  . CESAREAN SECTION     x 2  . DILATION AND CURETTAGE OF UTERUS  05/30/1987  . SPINE SURGERY  05/29/2004   lumbar  . TUBAL LIGATION     Current Outpatient Medications on File Prior to Visit  Medication Sig Dispense Refill  . Biotin 10 MG CAPS Take by mouth.    . cyclobenzaprine (FLEXERIL) 10 MG tablet Take 1 tablet (10 mg total) by mouth every 8 (eight) hours. 10 tablet 0  . ferrous sulfate 325 (65 FE) MG tablet TAKE 1 TABLET THREE TIMES A DAY WITH MEALS (Patient taking differently: daily with breakfast. ) 270 tablet 3  . HYDROcodone-acetaminophen (NORCO) 5-325 MG tablet Take 1-2 tablets by mouth every 6 (six) hours as needed for moderate pain or severe pain. 20 tablet 0  . lamoTRIgine (LAMICTAL) 25 MG tablet TAKE TWO TABLETS BY MOUTH DAILY AT BEDTIME  30 tablet 0   No current facility-administered medications on file prior to visit.    Allergies  Allergen Reactions  . Penicillins Hives and Rash   Social History   Socioeconomic History  . Marital status: Married    Spouse name: Not on file  . Number of children: Not on file  . Years of education: Not on file  . Highest education level: Not on file  Occupational History  . Not on file  Social Needs  . Financial resource strain: Not on file  . Food insecurity:    Worry: Not on file    Inability: Not on file  . Transportation needs:    Medical: Not on file    Non-medical: Not on file  Tobacco Use  . Smoking status: Current Every Day Smoker    Packs/day: 1.00    Types: Cigarettes  . Smokeless tobacco: Never Used  Substance and Sexual Activity  . Alcohol use: Yes    Comment: occasional  . Drug use: No  . Sexual activity: Yes    Birth control/protection: Surgical  Lifestyle  . Physical activity:    Days per week:  Not on file    Minutes per session: Not on file  . Stress: Not on file  Relationships  . Social connections:    Talks on phone: Not on file    Gets together: Not on file    Attends religious service: Not on file    Active member of club or organization: Not on file    Attends meetings of clubs or organizations: Not on file    Relationship status: Not on file  . Intimate partner violence:    Fear of current or ex partner: Not on file    Emotionally abused: Not on file    Physically abused: Not on file    Forced sexual activity: Not on file  Other Topics Concern  . 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   Married   2 children. 3619 and 51 y/o.      Review of Systems  All other systems reviewed and are negative.      Objective:   No physical exam was performed today as patient was seen over the telephone       Assessment & Plan:  Smoker  Hypertriglyceridemia - Plan: CBC with Differential/Platelet, COMPLETE METABOLIC PANEL WITH GFR, Lipid panel  Depression, major, single episode, moderate (HCC)   Continue fenofibrate however I would like the patient to come in fasting for a CBC, CMP, and a fasting lipid panel.  Continue Lexapro 20 mg a day.  However discontinue Lamictal and replace with Wellbutrin XL 150 mg p.o. every morning and reassess in 1 month.  Hopefully this will help augment her Lexapro and help manage her depression better.

## 2018-11-06 ENCOUNTER — Other Ambulatory Visit: Payer: Self-pay | Admitting: *Deleted

## 2018-11-06 MED ORDER — FERROUS SULFATE 325 (65 FE) MG PO TABS: ORAL_TABLET | ORAL | 3 refills | Status: DC

## 2018-11-11 ENCOUNTER — Other Ambulatory Visit: Payer: Self-pay | Admitting: Family Medicine

## 2018-11-11 MED ORDER — FERROUS SULFATE 325 (65 FE) MG PO TABS: 325.0000 mg | ORAL_TABLET | Freq: Every day | ORAL | 3 refills | Status: DC

## 2018-11-13 ENCOUNTER — Other Ambulatory Visit: Payer: Self-pay | Admitting: Family Medicine

## 2018-11-14 ENCOUNTER — Other Ambulatory Visit: Payer: Self-pay | Admitting: Family Medicine

## 2018-11-14 MED ORDER — FERROUS SULFATE 325 (65 FE) MG PO TABS: 325.0000 mg | ORAL_TABLET | Freq: Every day | ORAL | 3 refills | Status: DC

## 2018-11-27 ENCOUNTER — Encounter: Payer: Self-pay | Admitting: Family Medicine

## 2018-12-17 ENCOUNTER — Encounter: Payer: Self-pay | Admitting: Family Medicine

## 2018-12-20 ENCOUNTER — Other Ambulatory Visit: Payer: Medicaid Other

## 2018-12-20 ENCOUNTER — Other Ambulatory Visit: Payer: Self-pay

## 2018-12-20 DIAGNOSIS — Z419 Encounter for procedure for purposes other than remedying health state, unspecified: Secondary | ICD-10-CM

## 2018-12-21 LAB — COMPLETE METABOLIC PANEL WITH GFR
AG Ratio: 2.1 (calc) (ref 1.0–2.5)
ALT: 17 U/L (ref 6–29)
AST: 18 U/L (ref 10–35)
Albumin: 4.1 g/dL (ref 3.6–5.1)
Alkaline phosphatase (APISO): 76 U/L (ref 37–153)
BUN: 14 mg/dL (ref 7–25)
CO2: 20 mmol/L (ref 20–32)
Calcium: 9.2 mg/dL (ref 8.6–10.4)
Chloride: 108 mmol/L (ref 98–110)
Creat: 0.59 mg/dL (ref 0.50–1.05)
GFR, Est African American: 124 mL/min/{1.73_m2} (ref >=60)
GFR, Est Non African American: 107 mL/min/{1.73_m2} (ref >=60)
Globulin: 2 g/dL (calc) (ref 1.9–3.7)
Glucose, Bld: 81 mg/dL (ref 65–99)
Potassium: 4 mmol/L (ref 3.5–5.3)
Sodium: 138 mmol/L (ref 135–146)
Total Bilirubin: 0.3 mg/dL (ref 0.2–1.2)
Total Protein: 6.1 g/dL (ref 6.1–8.1)

## 2018-12-21 LAB — CBC WITH DIFFERENTIAL/PLATELET
Absolute Monocytes: 637 cells/uL (ref 200–950)
Basophils Absolute: 71 cells/uL (ref 0–200)
Basophils Relative: 1.2 %
Eosinophils Absolute: 100 cells/uL (ref 15–500)
Eosinophils Relative: 1.7 %
HCT: 37.7 % (ref 35.0–45.0)
Hemoglobin: 12.7 g/dL (ref 11.7–15.5)
Lymphs Abs: 1440 cells/uL (ref 850–3900)
MCH: 33.5 pg — ABNORMAL HIGH (ref 27.0–33.0)
MCHC: 33.7 g/dL (ref 32.0–36.0)
MCV: 99.5 fL (ref 80.0–100.0)
MPV: 12.4 fL (ref 7.5–12.5)
Monocytes Relative: 10.8 %
Neutro Abs: 3652 cells/uL (ref 1500–7800)
Neutrophils Relative %: 61.9 %
Platelets: 245 10*3/uL (ref 140–400)
RBC: 3.79 10*6/uL — ABNORMAL LOW (ref 3.80–5.10)
RDW: 12.3 % (ref 11.0–15.0)
Total Lymphocyte: 24.4 %
WBC: 5.9 10*3/uL (ref 3.8–10.8)

## 2018-12-21 LAB — LIPID PANEL
Cholesterol: 181 mg/dL (ref <200)
HDL: 51 mg/dL (ref >=50)
LDL Cholesterol (Calc): 107 mg/dL (calc) — ABNORMAL HIGH
Non-HDL Cholesterol (Calc): 130 mg/dL (calc) — ABNORMAL HIGH (ref <130)
Total CHOL/HDL Ratio: 3.5 (calc) (ref <5.0)
Triglycerides: 124 mg/dL (ref <150)

## 2018-12-23 LAB — ABO AND RH

## 2019-01-07 ENCOUNTER — Encounter: Payer: Self-pay | Admitting: Family Medicine

## 2019-01-07 ENCOUNTER — Other Ambulatory Visit: Payer: Self-pay | Admitting: Family Medicine

## 2019-01-08 ENCOUNTER — Other Ambulatory Visit: Payer: Self-pay

## 2019-01-08 ENCOUNTER — Other Ambulatory Visit: Payer: Self-pay | Admitting: Family Medicine

## 2019-01-08 ENCOUNTER — Ambulatory Visit (INDEPENDENT_AMBULATORY_CARE_PROVIDER_SITE_OTHER): Payer: Medicaid Other | Admitting: Family Medicine

## 2019-01-08 ENCOUNTER — Encounter: Payer: Self-pay | Admitting: Family Medicine

## 2019-01-08 VITALS — Ht 65.0 in | Wt 180.0 lb

## 2019-01-08 DIAGNOSIS — Z713 Dietary counseling and surveillance: Secondary | ICD-10-CM

## 2019-01-08 MED ORDER — PHENTERMINE HCL 37.5 MG PO TABS: 37.5000 mg | ORAL_TABLET | Freq: Every day | ORAL | 2 refills | Status: DC

## 2019-01-08 NOTE — Progress Notes (Signed)
Subjective:    Patient ID: Lisa Gates, female    DOB: 04/14/1966, 53 y.o.   MRN: [id number]  HPI Patient is being seen today as a telephone visit.  She consents to be seen by telephone.  Phone call began at 203.  Phone call concluded at 218.  She is requesting assistance with weight loss.  She states that over the last 5 months, she is exercising 30 minutes a day 7 days a week.  She has drastically changed her diet.  She is eating mainly salads and fruits and vegetables.  She is avoiding carbohydrates.  She is cut out the fried foods.  She has discontinued sodas and sugary drinks.  However despite making aggressive lifestyle changes and calorie restrictions, she is only been able to lose 2 pounds.  She initially asked about me prescribing naltrexone to help with weight loss.  She is already on bupropion.  Essentially the patient was trying to formulate Contrave.  However her BMI would not qualify for Contrave.  The patient is 180 pounds.  She is 5 foot 5.  This gives her a BMI of approximately 30.  I do not believe that this would qualify for Contrave.  Furthermore it would be cost prohibitive to pay cash for this.  We discussed other options for weight loss including Saxenda as well as Adipex.  From a cost standpoint I believe Adipex would be her safest option.  She denies any chest pain.  She denies any shortness of breath.  She states that she usually checks her blood pressure at home and is normal however she has not checked in quite some time. Past Medical History:  Diagnosis Date  . Abnormal nerve conduction studies 03/01/2013   left arm  . Anemia   . Atypical ductal hyperplasia of left breast 04/24/2018  . 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 BREAST LUMPECTOMY WITH RADIOACTIVE SEED LOCALIZATION Left 04/24/2018   Procedure: LEFT BREAST  LUMPECTOMY WITH RADIOACTIVE SEED LOCALIZATION;  Surgeon: Claud KelpIngram, Hay[place], MD;  Location: [facility];  Service: General;  Laterality: Left;  . CESAREAN SECTION     x 2  . DILATION AND CURETTAGE OF UTERUS  05/30/1987  . SPINE SURGERY  05/29/2004   lumbar  . TUBAL LIGATION     Current Outpatient Medications on File Prior to Visit  Medication Sig Dispense Refill  . Biotin 10 MG CAPS Take by mouth.    Marland Kitchen. buPROPion (WELLBUTRIN XL) 150 MG 24 hr tablet Take 1 tablet (150 mg total) by mouth daily. 90 tablet 1  . cyclobenzaprine (FLEXERIL) 10 MG tablet Take 1 tablet (10 mg total) by mouth every 8 (eight) hours. 10 tablet 0  . escitalopram (LEXAPRO) 20 MG tablet Take 1 tablet (20 mg total) by mouth daily. 90 tablet 3  . fenofibrate 160 MG tablet Take 1 tablet (160 mg total) by mouth daily. 30 tablet 0  . ferrous sulfate 325 (65 FE) MG tablet Take 1 tablet (325 mg total) by mouth daily with breakfast. 90 tablet 3  . HYDROcodone-acetaminophen (NORCO) 5-325 MG tablet Take 1-2 tablets by mouth every 6 (six) hours as needed for moderate pain or severe pain. 20 tablet 0  . lamoTRIgine (LAMICTAL) 25 MG tablet TAKE TWO TABLETS BY MOUTH DAILY AT BEDTIME  30 tablet 0   No current facility-administered medications on file  prior to visit.    Allergies  Allergen Reactions  . Penicillins Hives and Rash   Social History   Socioeconomic History  . Marital status: Married    Spouse name: Not on file  . Number of children: Not on file  . Years of education: Not on file  . Highest education level: Not on file  Occupational History  . Not on file  Social Needs  . Financial resource strain: Not on file  . Food insecurity    Worry: Not on file    Inability: Not on file  . Transportation needs    Medical: Not on file    Non-medical: Not on file  Tobacco Use  . Smoking status: Current Every Day Smoker    Packs/day: 1.00    Types: Cigarettes  . Smokeless tobacco: Never Used  Substance and  Sexual Activity  . Alcohol use: Yes    Comment: occasional  . Drug use: No  . Sexual activity: Yes    Birth control/protection: Surgical  Lifestyle  . Physical activity    Days per week: Not on file    Minutes per session: Not on file  . Stress: Not on file  Relationships  . Social Herbalist on phone: Not on file    Gets together: Not on file    Attends religious service: Not on file    Active member of club or organization: Not on file    Attends meetings of clubs or organizations: Not on file    Relationship status: Not on file  . Intimate partner violence    Fear of current or ex partner: Not on file    Emotionally abused: Not on file    Physically abused: Not on file    Forced sexual activity: Not on file  Other Topics Concern  . 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   Married   2 children. 60 and 69 y/o.      Review of Systems  All other systems reviewed and are negative.      Objective:   Physical Exam  Physical exam could not be performed today as patient was seen as a telephone visit      Assessment & Plan:  The encounter diagnosis was Weight loss counseling, encounter for. In addition to therapeutic lifestyle changes that the patient has already instituted, I have recommended trying Adipex 37.5 mg p.o. every morning.  I will give her 30-day supply with 2 refills.  I would not refill the medication for longer than 73-month supply.  We discussed the risk of habituation if used consistently.  Of asked the patient to check her blood pressure prior to starting the medication and if her blood pressures greater than 140/90 I would not recommend using the medication.  I also recommended that she monitor her heart rate and her blood pressure while on the medication.  I warned the patient that she should take in the morning to avoid insomnia and if she develops any chest pain or palpitations she  needs to discontinue the medication immediately.  Patient is comfortable with this plan.

## 2019-01-16 ENCOUNTER — Other Ambulatory Visit: Payer: Self-pay | Admitting: *Deleted

## 2019-01-16 MED ORDER — FENOFIBRATE 160 MG PO TABS: 160.0000 mg | ORAL_TABLET | Freq: Every day | ORAL | 6 refills | Status: DC

## 2019-02-12 ENCOUNTER — Other Ambulatory Visit: Payer: Self-pay | Admitting: Family Medicine

## 2019-02-12 MED ORDER — FENOFIBRATE 160 MG PO TABS: 160.0000 mg | ORAL_TABLET | Freq: Every day | ORAL | 6 refills | Status: DC

## 2019-05-23 ENCOUNTER — Other Ambulatory Visit: Payer: Self-pay | Admitting: Family Medicine

## 2019-06-13 ENCOUNTER — Other Ambulatory Visit: Payer: Self-pay | Admitting: Family Medicine

## 2019-06-13 MED ORDER — LAMOTRIGINE 25 MG PO TABS: ORAL_TABLET | ORAL | 1 refills | Status: DC

## 2019-06-13 MED ORDER — FENOFIBRATE 160 MG PO TABS: 160.0000 mg | ORAL_TABLET | Freq: Every day | ORAL | 1 refills | Status: DC

## 2019-06-13 MED ORDER — BUPROPION HCL ER (XL) 150 MG PO TB24: 150.0000 mg | ORAL_TABLET | Freq: Every day | ORAL | 1 refills | Status: DC

## 2019-08-10 ENCOUNTER — Encounter: Payer: Self-pay | Admitting: Family Medicine

## 2019-08-21 ENCOUNTER — Other Ambulatory Visit: Payer: Self-pay | Admitting: Family Medicine

## 2019-08-21 ENCOUNTER — Encounter: Payer: Self-pay | Admitting: Family Medicine

## 2019-08-21 ENCOUNTER — Telehealth (INDEPENDENT_AMBULATORY_CARE_PROVIDER_SITE_OTHER): Payer: Medicaid Other | Admitting: Family Medicine

## 2019-08-21 DIAGNOSIS — F439 Reaction to severe stress, unspecified: Secondary | ICD-10-CM

## 2019-08-21 MED ORDER — ALPRAZOLAM 0.5 MG PO TABS: 0.5000 mg | ORAL_TABLET | Freq: Three times a day (TID) | ORAL | 0 refills | Status: DC | PRN

## 2019-08-21 NOTE — Progress Notes (Signed)
Subjective:    Patient ID: Lisa Gates, female    DOB: 07/18/1965, 54 y.o.   MRN: [id number]  HPI  Patient is being seen today as a telephone visit.  Phone call began at 12:00.  Phone call concluded at 1215.  Patient consents to be seen via telephone.  In December, she separated from her husband.  Previously she was taking 20 mg a day of Lexapro, 150 mg every day of Wellbutrin XL, and Lamictal 25 mg twice a day for mood swings.  However she feels that the Lamictal leaves her feeling extremely tired and fatigued.  She feels sleepy on the medication.  She also feels good the majority of the time however at other times she feels overwhelmed.  She is stressful.  She feels extremely emotional.  She feels anxiety.  This occurs randomly and frequently throughout the day.  Symptoms originated when her husband separated from her in December.  This is an ongoing situation with no end in sight.  She denies any depression.  She denies any suicidal ideation.  She denies any mania or increased goal-directed behavior or tangential thoughts.  She does not demonstrate pressured speech. Past Medical History:  Diagnosis Date  . Abnormal nerve conduction studies 03/01/2013   left arm  . Anemia   . Atypical ductal hyperplasia of left breast 04/24/2018  . 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 BREAST LUMPECTOMY WITH RADIOACTIVE SEED LOCALIZATION Left 04/24/2018   Procedure: LEFT BREAST LUMPECTOMY WITH RADIOACTIVE SEED LOCALIZATION;  Surgeon: Claud Kelp, MD;  Location: [facility];  Service: General;  Laterality: Left;  . CESAREAN SECTION     x 2  . DILATION AND CURETTAGE OF UTERUS  05/30/1987  . SPINE SURGERY  05/29/2004   lumbar  . TUBAL LIGATION     Current Outpatient Medications on File Prior to Visit  Medication Sig Dispense Refill  .  Biotin 10 MG CAPS Take by mouth.    Marland Kitchen buPROPion (WELLBUTRIN XL) 150 MG 24 hr tablet Take 1 tablet (150 mg total) by mouth daily. 90 tablet 1  . cyclobenzaprine (FLEXERIL) 10 MG tablet Take 1 tablet (10 mg total) by mouth every 8 (eight) hours. 10 tablet 0  . escitalopram (LEXAPRO) 20 MG tablet Take 1 tablet (20 mg total) by mouth daily. 90 tablet 3  . fenofibrate 160 MG tablet Take 1 tablet (160 mg total) by mouth daily. 90 tablet 1  . ferrous sulfate 325 (65 FE) MG tablet Take 1 tablet (325 mg total) by mouth daily with breakfast. 90 tablet 3  . HYDROcodone-acetaminophen (NORCO) 5-325 MG tablet Take 1-2 tablets by mouth every 6 (six) hours as needed for moderate pain or severe pain. 20 tablet 0  . lamoTRIgine (LAMICTAL) 25 MG tablet TAKE TWO TABLETS BY MOUTH DAILY at bedtime *NEEDS OFFICE VISIT FOR ADDITIONAL REFILLS* 90 tablet 1  . phentermine (ADIPEX-P) 37.5 MG tablet Take 1 tablet (37.5 mg total) by mouth daily before breakfast. 30 tablet 2   No current facility-administered medications on file prior to visit.   Allergies  Allergen Reactions  . Penicillins Hives and Rash   Social History   Socioeconomic History  . Marital status: Married    Spouse name: Not on file  . Number of children: Not on file  . Years of education: Not on file  . Highest  education level: Not on file  Occupational History  . Not on file  Tobacco Use  . Smoking status: Current Every Day Smoker    Packs/day: 1.00    Types: Cigarettes  . Smokeless tobacco: Never Used  Substance and Sexual Activity  . Alcohol use: Yes    Comment: occasional  . Drug use: No  . Sexual activity: Yes    Birth control/protection: Surgical  Other Topics Concern  . 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   Married   2 children. 54 and 83 y/o.   Social Determinants of Health   Financial Resource Strain:   . Difficulty of Paying Living Expenses:   Food  Insecurity:   . Worried About Charity fundraiser in the Last Year:   . Arboriculturist in the Last Year:   Transportation Needs:   . Film/video editor (Medical):   Marland Kitchen Lack of Transportation (Non-Medical):   Physical Activity:   . Days of Exercise per Week:   . Minutes of Exercise per Session:   Stress:   . Feeling of Stress :   Social Connections:   . Frequency of Communication with Friends and Family:   . Frequency of Social Gatherings with Friends and Family:   . Attends Religious Services:   . Active Member of Clubs or Organizations:   . Attends Archivist Meetings:   Marland Kitchen Marital Status:   Intimate Partner Violence:   . Fear of Current or Ex-Partner:   . Emotionally Abused:   Marland Kitchen Physically Abused:   . Sexually Abused:      Review of Systems  All other systems reviewed and are negative.      Objective:   Physical Exam        Assessment & Plan:  Situational stress  Patient would like to stop Lamictal therefore we will discontinue that medication altogether.  Instead we will replace with Xanax 0.4 mg tablets 1 every 8 hours to be used sparingly as needed for anxiety or stress.  I cautioned the patient not to use the medication when she is driving.  We discussed about the potential side effects including sedation dizziness and habituation if used too often for too long period of time.  The patient will use the medication sparingly to help her make it through this difficult time.

## 2019-10-23 ENCOUNTER — Other Ambulatory Visit: Payer: Self-pay | Admitting: Family Medicine

## 2019-12-07 IMAGING — MG DIGITAL SCREENING BILATERAL MAMMOGRAM WITH TOMO AND CAD
8 series · 8 of 24 positions shown · non-contrast
Comparison: Previous exam(s).

CLINICAL DATA: Screening.

EXAM:
DIGITAL SCREENING BILATERAL MAMMOGRAM WITH TOMO AND CAD

[R CC synth-2D]
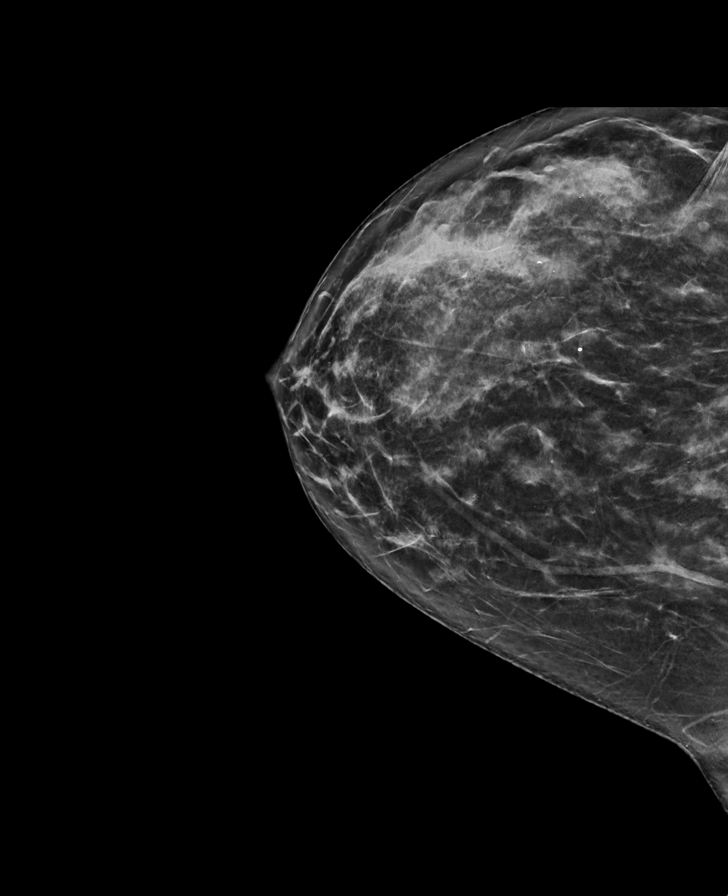

[L MLO synth-2D]
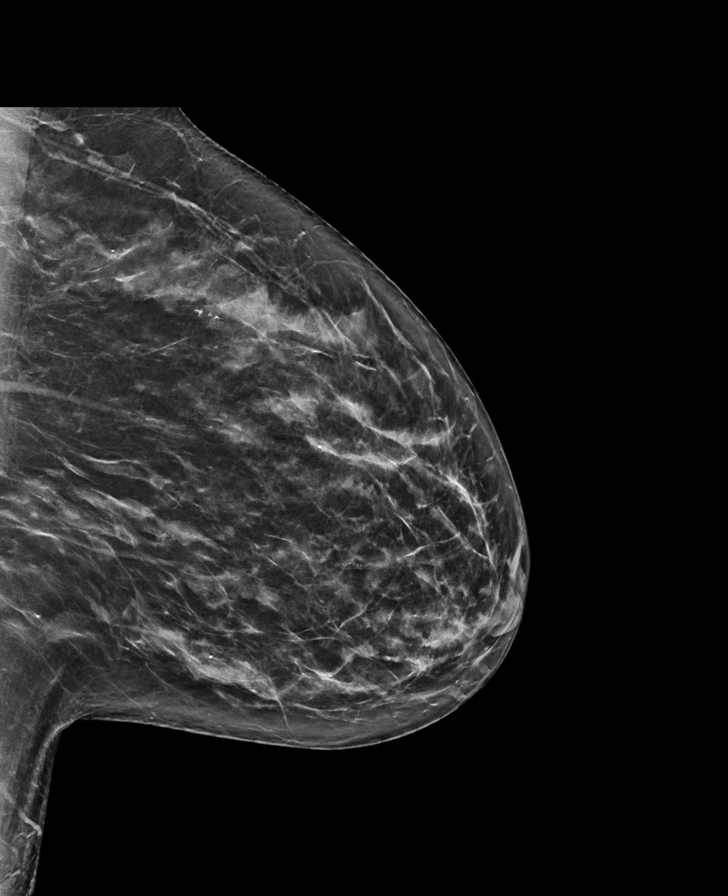

[L CC synth-2D]
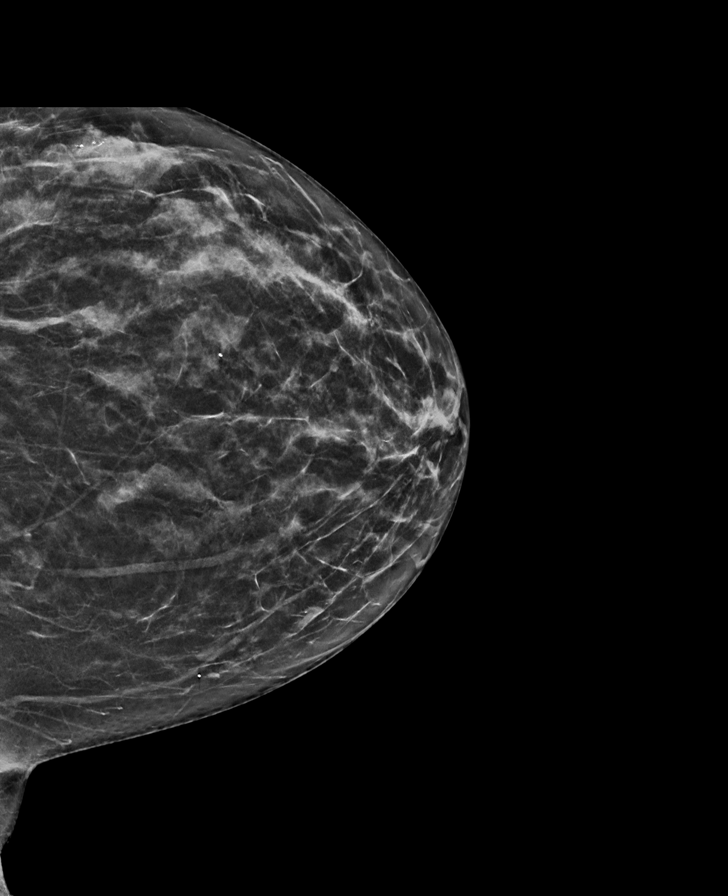

[R MLO synth-2D]
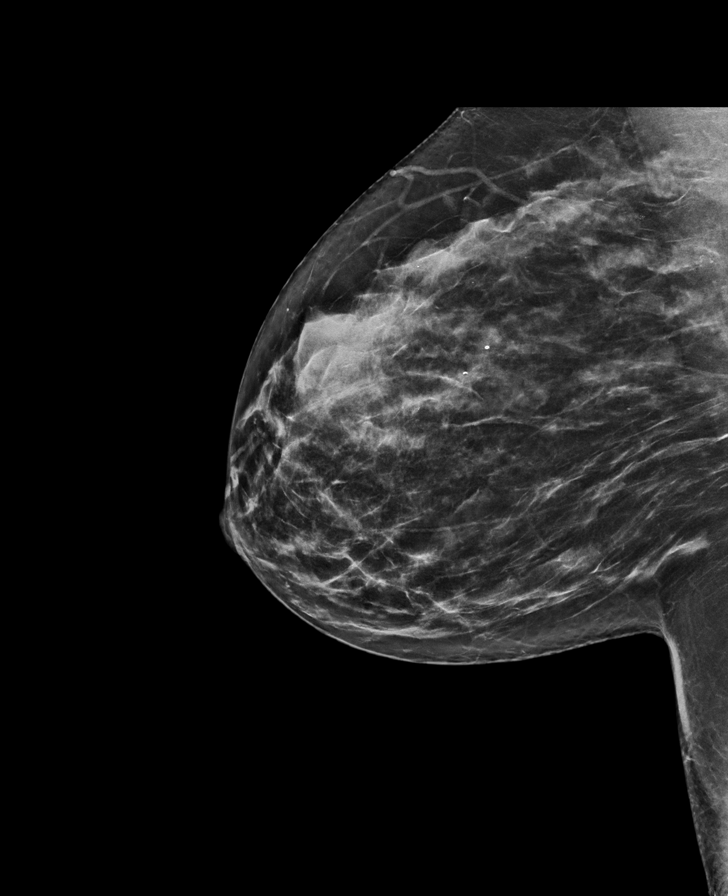

[L MLO tomo · tomo slice 37/73.0]
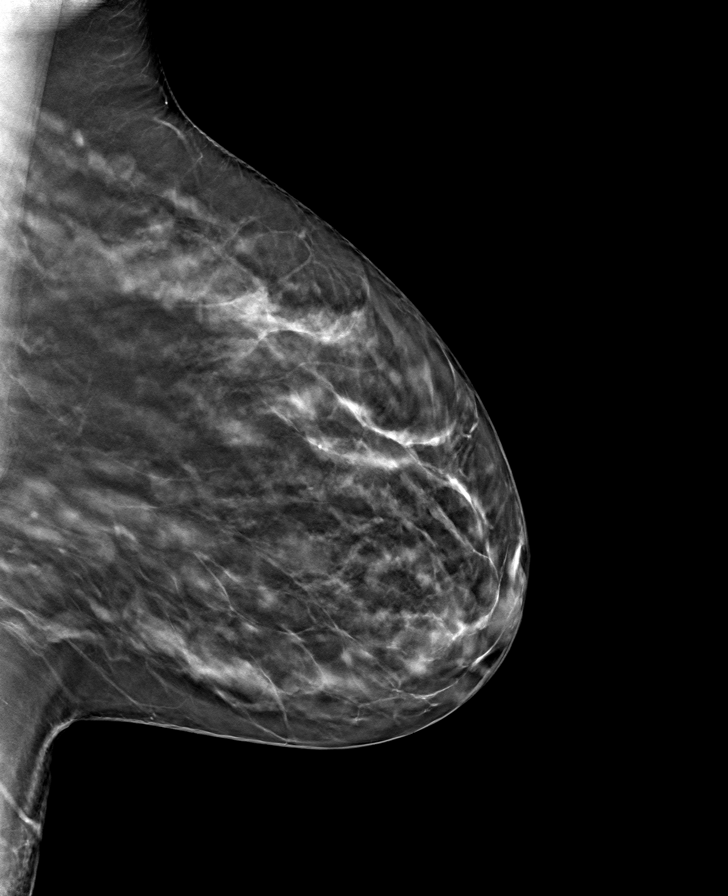

[R CC tomo · tomo slice 35/68.0]
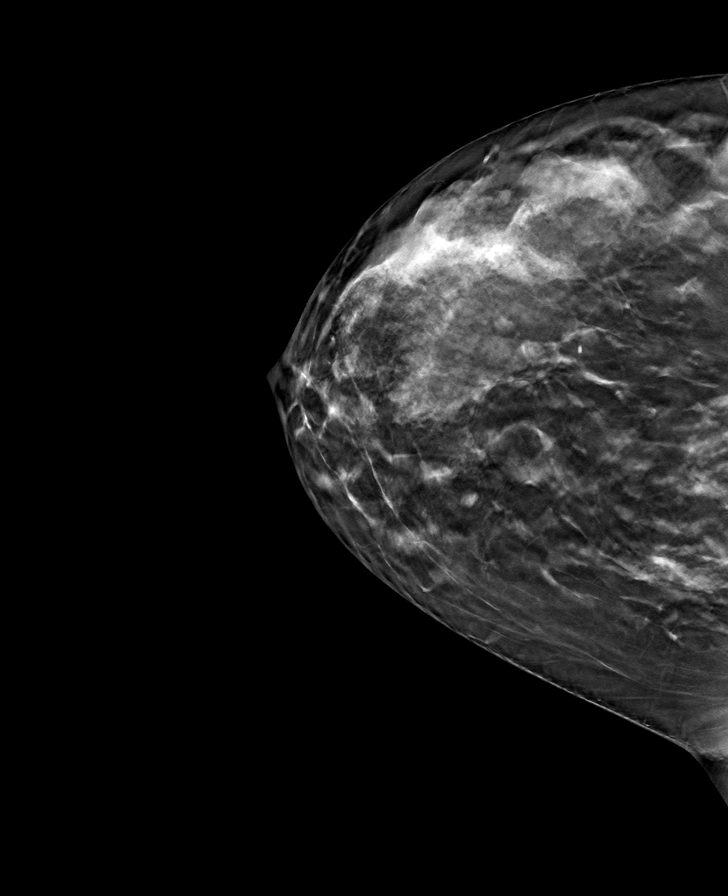

[L CC tomo · tomo slice 31/61.0]
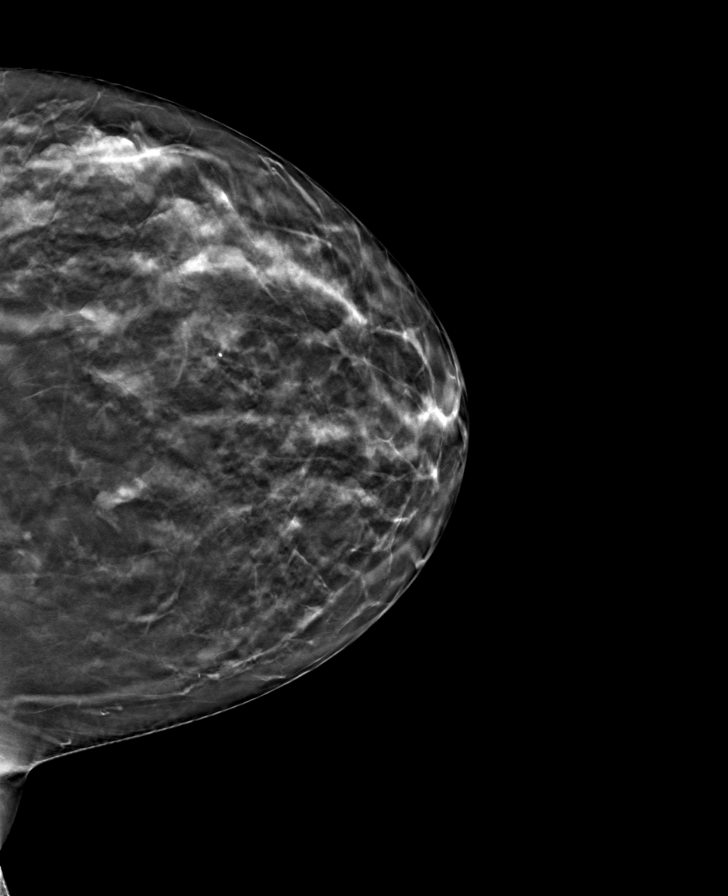

[R MLO tomo · tomo slice 36/71.0]
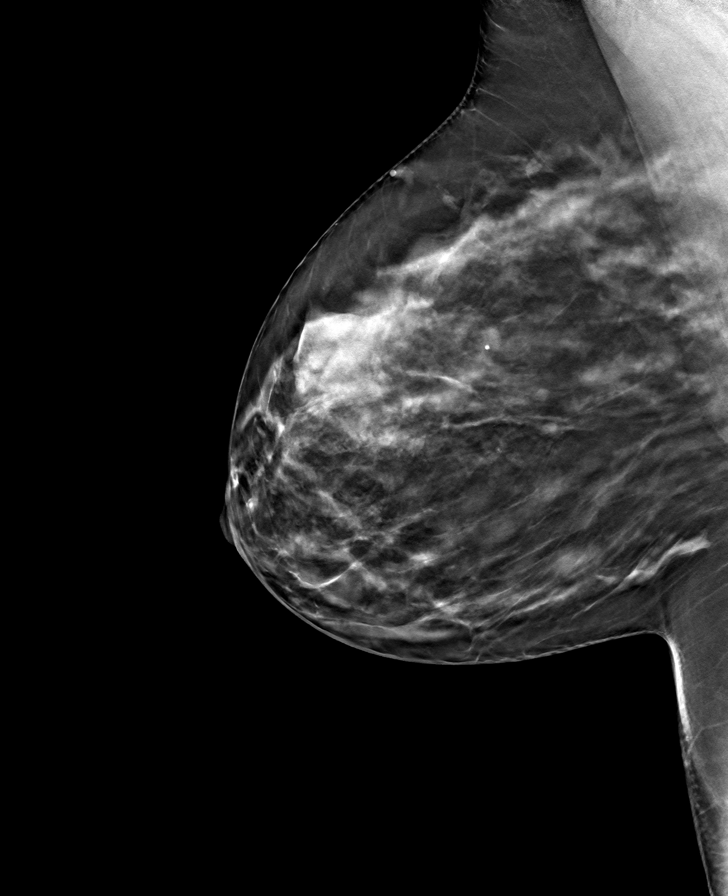

[8 of 24 positions shown; findings below may reference images not displayed]

ACR Breast Density Category c: The breast tissue is heterogeneously
dense, which may obscure small masses.
FINDINGS: In the left breast, calcifications warrant further evaluation. In
the right breast, no findings suspicious for malignancy. Images were
processed with CAD.
IMPRESSION: Further evaluation is suggested for calcifications in the left
breast.

RECOMMENDATION:
Diagnostic mammogram of the left breast. (Code:JX-[id number])

The patient will be contacted regarding the findings, and additional
imaging will be scheduled.

BI-RADS CATEGORY  0: Incomplete. Need additional imaging evaluation
and/or prior mammograms for comparison.

## 2019-12-25 ENCOUNTER — Encounter: Payer: Self-pay | Admitting: Family Medicine

## 2019-12-29 ENCOUNTER — Other Ambulatory Visit: Payer: Self-pay

## 2019-12-29 ENCOUNTER — Ambulatory Visit: Payer: Self-pay

## 2019-12-29 ENCOUNTER — Other Ambulatory Visit: Payer: Self-pay | Admitting: Family Medicine

## 2019-12-29 ENCOUNTER — Other Ambulatory Visit: Payer: 59

## 2019-12-29 DIAGNOSIS — Z713 Dietary counseling and surveillance: Secondary | ICD-10-CM

## 2019-12-29 DIAGNOSIS — Z1231 Encounter for screening mammogram for malignant neoplasm of breast: Secondary | ICD-10-CM

## 2019-12-29 LAB — COMPLETE METABOLIC PANEL WITH GFR
ALT: 16 U/L (ref 6–29)
Albumin: 4.7 g/dL (ref 3.6–5.1)
BUN: 10 mg/dL (ref 7–25)
Chloride: 103 mmol/L (ref 98–110)
GFR, Est African American: 119 mL/min/{1.73_m2} (ref >=60)

## 2019-12-29 LAB — CBC WITH DIFFERENTIAL/PLATELET: HCT: 39.4 % (ref 35.0–45.0)

## 2019-12-31 ENCOUNTER — Other Ambulatory Visit: Payer: Self-pay

## 2019-12-31 MED ORDER — FENOFIBRATE 160 MG PO TABS: 160.0000 mg | ORAL_TABLET | Freq: Every day | ORAL | 0 refills | Status: DC

## 2020-01-01 ENCOUNTER — Ambulatory Visit (INDEPENDENT_AMBULATORY_CARE_PROVIDER_SITE_OTHER): Payer: 59 | Admitting: Family Medicine

## 2020-01-01 ENCOUNTER — Other Ambulatory Visit: Payer: Self-pay

## 2020-01-01 VITALS — Temp 97.2°F | Ht 65.0 in | Wt 183.0 lb

## 2020-01-01 DIAGNOSIS — E781 Pure hyperglyceridemia: Secondary | ICD-10-CM | POA: Diagnosis not present

## 2020-01-01 DIAGNOSIS — F439 Reaction to severe stress, unspecified: Secondary | ICD-10-CM

## 2020-01-01 DIAGNOSIS — F321 Major depressive disorder, single episode, moderate: Secondary | ICD-10-CM | POA: Diagnosis not present

## 2020-01-01 DIAGNOSIS — Z713 Dietary counseling and surveillance: Secondary | ICD-10-CM

## 2020-01-01 MED ORDER — PHENTERMINE HCL 37.5 MG PO CAPS: 37.5000 mg | ORAL_CAPSULE | ORAL | 2 refills | Status: DC

## 2020-01-01 NOTE — Progress Notes (Signed)
Subjective:    Patient ID: Lisa Gates, female    DOB: [birth year], 54 y.o.   MRN: [id number]  HPI Please see the last office visit.  Patient states that she is no longer taking Xanax.  She is consistently taking Lexapro 20 mg a day and Wellbutrin 150 mg a day.  However her stress level is extremely high.  She is divorcing her husband and they have been separated for 7 months.  This is causing her to have a difficult time sleeping at night leaving her feeling extremely tired during the day.  She does not feel that she is getting good restful sleep.  She reports hypersomnolence during the day.  She denies any suicidal ideation.  She denies any homicidal ideation.  She denies any hallucinations.  She denies any mania.  She denies any delusions.  Overall the depression seems adequately controlled with the Lexapro and the Wellbutrin however she would be interested in seeing a psychiatrist to help in the management of her depression.  She is also on fenofibrate for elevated triglycerides.  Her most recent lab work is listed below however the lab forgot to check her cholesterol level.  Therefore we have added that today but it is not back yet. Lab on 12/29/2019  Component Date Value Ref Range Status  . WBC 12/29/2019 6.6  3.8 - 10.8 Thousand/uL Final  . RBC 12/29/2019 3.87  3.80 - 5.10 Million/uL Final  . Hemoglobin 12/29/2019 12.8  11.7 - 15.5 g/dL Final  . HCT 85/06/7739 39.4  35 - 45 % Final  . MCV 12/29/2019 101.8* 80.0 - 100.0 fL Final  . MCH 12/29/2019 33.1* 27.0 - 33.0 pg Final  . MCHC 12/29/2019 32.5  32.0 - 36.0 g/dL Final  . RDW 28/78/6767 11.8  11.0 - 15.0 % Final  . Platelets 12/29/2019 374  140 - 400 Thousand/uL Final  . MPV 12/29/2019 11.3  7.5 - 12.5 fL Final  . Neutro Abs 12/29/2019 4,231  1,500 - 7,800 cells/uL Final  . Lymphs Abs 12/29/2019 1,657  850 - 3,900 cells/uL Final  . Absolute Monocytes 12/29/2019 521  200 - 950 cells/uL Final  . Eosinophils Absolute 12/29/2019 99   15 - 500 cells/uL Final  . Basophils Absolute 12/29/2019 92  0 - 200 cells/uL Final  . Neutrophils Relative % 12/29/2019 64.1  % Final  . Total Lymphocyte 12/29/2019 25.1  % Final  . Monocytes Relative 12/29/2019 7.9  % Final  . Eosinophils Relative 12/29/2019 1.5  % Final  . Basophils Relative 12/29/2019 1.4  % Final  . Glucose, Bld 12/29/2019 73  65 - 99 mg/dL Final   Comment: .            Fasting reference interval .   . BUN 12/29/2019 10  7 - 25 mg/dL Final  . Creat 20/94/7096 0.65  0.50 - 1.05 mg/dL Final   Comment: For patients >4 years of age, the reference limit for Creatinine is approximately 13% higher for people identified as African-American. .   . GFR, Est Non African American 12/29/2019 103  > OR = 60 mL/min/1.58m2 Final  . GFR, Est African American 12/29/2019 119  > OR = 60 mL/min/1.45m2 Final  . BUN/Creatinine Ratio 12/29/2019 NOT APPLICABLE  6 - 22 (calc) Final  . Sodium 12/29/2019 138  135 - 146 mmol/L Final  . Potassium 12/29/2019 4.3  3.5 - 5.3 mmol/L Final  . Chloride 12/29/2019 103  98 - 110 mmol/L Final  . CO2 12/29/2019  23  20 - 32 mmol/L Final  . Calcium 12/29/2019 10.1  8.6 - 10.4 mg/dL Final  . Total Protein 12/29/2019 7.2  6.1 - 8.1 g/dL Final  . Albumin 16/10/960408/06/2019 4.7  3.6 - 5.1 g/dL Final  . Globulin 54/09/811908/06/2019 2.5  1.9 - 3.7 g/dL (calc) Final  . AG Ratio 12/29/2019 1.9  1.0 - 2.5 (calc) Final  . Total Bilirubin 12/29/2019 0.3  0.2 - 1.2 mg/dL Final  . Alkaline phosphatase (APISO) 12/29/2019 86  37 - 153 U/L Final  . AST 12/29/2019 22  10 - 35 U/L Final  . ALT 12/29/2019 16  6 - 29 U/L Final   Past Medical History:  Diagnosis Date  . Abnormal nerve conduction studies 03/01/2013   left arm  . Anemia   . Atypical ductal hyperplasia of left breast 04/24/2018  . 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  BREAST LUMPECTOMY WITH RADIOACTIVE SEED LOCALIZATION Left 04/24/2018   Procedure: LEFT BREAST LUMPECTOMY WITH RADIOACTIVE SEED LOCALIZATION;  Surgeon: Claud KelpIngram, Haywood, MD;  Location: [facility];  Service: General;  Laterality: Left;  . CESAREAN SECTION     x 2  . DILATION AND CURETTAGE OF UTERUS  05/30/1987  . SPINE SURGERY  05/29/2004   lumbar  . TUBAL LIGATION     Current Outpatient Medications on File Prior to Visit  Medication Sig Dispense Refill  . Biotin 10 MG CAPS Take by mouth.    Marland Kitchen. buPROPion (WELLBUTRIN XL) 150 MG 24 hr tablet Take 1 tablet (150 mg total) by mouth daily. 90 tablet 1  . escitalopram (LEXAPRO) 20 MG tablet TAKE (1) TABLET BY MOUTH ONCE DAILY. 90 tablet 0  . fenofibrate 160 MG tablet Take 1 tablet (160 mg total) by mouth daily. 30 tablet 0  . ferrous sulfate 325 (65 FE) MG tablet Take 1 tablet (325 mg total) by mouth daily with breakfast. 90 tablet 3  . ALPRAZolam (XANAX) 0.5 MG tablet Take 1 tablet (0.5 mg total) by mouth 3 (three) times daily as needed. (Patient not taking: Reported on 01/01/2020) 30 tablet 0  . cyclobenzaprine (FLEXERIL) 10 MG tablet Take 1 tablet (10 mg total) by mouth every 8 (eight) hours. (Patient not taking: Reported on 01/01/2020) 10 tablet 0  . HYDROcodone-acetaminophen (NORCO) 5-325 MG tablet Take 1-2 tablets by mouth every 6 (six) hours as needed for moderate pain or severe pain. (Patient not taking: Reported on 01/01/2020) 20 tablet 0  . lamoTRIgine (LAMICTAL) 25 MG tablet TAKE TWO TABLETS BY MOUTH DAILY at bedtime *NEEDS OFFICE VISIT FOR ADDITIONAL REFILLS* (Patient not taking: Reported on 01/01/2020) 90 tablet 1  . phentermine (ADIPEX-P) 37.5 MG tablet Take 1 tablet (37.5 mg total) by mouth daily before breakfast. (Patient not taking: Reported on 01/01/2020) 30 tablet 2   No current facility-administered medications on file prior to visit.   Allergies  Allergen Reactions  . Penicillins Hives and Rash   Social History    Socioeconomic History  . Marital status: Legally Separated    Spouse name: Not on file  . Number of children: Not on file  . Years of education: Not on file  . Highest education level: Not on file  Occupational History  . Not on file  Tobacco Use  . Smoking status: Current Every Day Smoker    Packs/day: 1.00    Types: Cigarettes  .  Smokeless tobacco: Never Used  Vaping Use  . Vaping Use: Former  Substance and Sexual Activity  . Alcohol use: Yes    Comment: occasional  . Drug use: No  . Sexual activity: Yes    Birth control/protection: Surgical  Other Topics Concern  . 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   Married   2 children. 30 and 38 y/o.   Social Determinants of Health   Financial Resource Strain:   . Difficulty of Paying Living Expenses:   Food Insecurity:   . Worried About Programme researcher, broadcasting/film/video in the Last Year:   . Barista in the Last Year:   Transportation Needs:   . Freight forwarder (Medical):   Marland Kitchen Lack of Transportation (Non-Medical):   Physical Activity:   . Days of Exercise per Week:   . Minutes of Exercise per Session:   Stress:   . Feeling of Stress :   Social Connections:   . Frequency of Communication with Friends and Family:   . Frequency of Social Gatherings with Friends and Family:   . Attends Religious Services:   . Active Member of Clubs or Organizations:   . Attends Banker Meetings:   Marland Kitchen Marital Status:   Intimate Partner Violence:   . Fear of Current or Ex-Partner:   . Emotionally Abused:   Marland Kitchen Physically Abused:   . Sexually Abused:       Review of Systems  All other systems reviewed and are negative.      Objective:   Physical Exam Vitals reviewed.  Constitutional:      General: She is not in acute distress.    Appearance: She is obese. She is not ill-appearing or toxic-appearing.  Cardiovascular:     Rate and Rhythm: Normal rate  and regular rhythm.     Heart sounds: Normal heart sounds. No murmur heard.  No gallop.   Pulmonary:     Effort: Pulmonary effort is normal. No respiratory distress.     Breath sounds: Normal breath sounds. No wheezing or rales.  Abdominal:     General: Bowel sounds are normal.     Palpations: Abdomen is soft.  Neurological:     General: No focal deficit present.     Mental Status: She is alert and oriented to person, place, and time.     Cranial Nerves: No cranial nerve deficit.  Psychiatric:        Mood and Affect: Mood normal.        Behavior: Behavior normal.        Thought Content: Thought content normal.        Judgment: Judgment normal.           Assessment & Plan:  Situational stress  Weight loss counseling, encounter for  Hypertriglyceridemia  Depression, major, single episode, moderate (HCC)  For the time being continue Lexapro 20 mg a day Wellbutrin 150 mg a day.  Continue to use Xanax sparingly.  Recommended consultation with a psychologist/psychiatrist.  Provided the patient the recommendation of [facility].  Encourage patient to contact their office and make an appointment.  CBC and CMP are excellent.  MCV is slightly elevated.  Patient could try vitamin B12.  Check a fasting lipid panel.  Goal LDL cholesterol is less than 130.  Goal triglycerides are less than 200.  Patient asked for assistance in weight loss.  Last year she used phentermine  for 2 months and had good success.  She would like to try that medication again to help jumpstart her efforts which include therapeutic lifestyle changes.  Begin phentermine 37.5 mg p.o. every morning x 90 days.

## 2020-01-03 LAB — LIPID PANEL
Cholesterol: 230 mg/dL — ABNORMAL HIGH (ref <200)
HDL: 74 mg/dL (ref >=50)
LDL Cholesterol (Calc): 124 mg/dL (calc) — ABNORMAL HIGH
Non-HDL Cholesterol (Calc): 156 mg/dL (calc) — ABNORMAL HIGH (ref <130)
Total CHOL/HDL Ratio: 3.1 (calc) (ref <5.0)
Triglycerides: 196 mg/dL — ABNORMAL HIGH (ref <150)

## 2020-01-03 LAB — TEST AUTHORIZATION

## 2020-01-03 LAB — CBC WITH DIFFERENTIAL/PLATELET
Absolute Monocytes: 521 cells/uL (ref 200–950)
Basophils Absolute: 92 cells/uL (ref 0–200)
Basophils Relative: 1.4 %
Eosinophils Absolute: 99 cells/uL (ref 15–500)
Eosinophils Relative: 1.5 %
Hemoglobin: 12.8 g/dL (ref 11.7–15.5)
Lymphs Abs: 1657 cells/uL (ref 850–3900)
MCH: 33.1 pg — ABNORMAL HIGH (ref 27.0–33.0)
MCHC: 32.5 g/dL (ref 32.0–36.0)
MCV: 101.8 fL — ABNORMAL HIGH (ref 80.0–100.0)
MPV: 11.3 fL (ref 7.5–12.5)
Monocytes Relative: 7.9 %
Neutro Abs: 4231 cells/uL (ref 1500–7800)
Neutrophils Relative %: 64.1 %
Platelets: 374 10*3/uL (ref 140–400)
RBC: 3.87 10*6/uL (ref 3.80–5.10)
RDW: 11.8 % (ref 11.0–15.0)
Total Lymphocyte: 25.1 %
WBC: 6.6 10*3/uL (ref 3.8–10.8)

## 2020-01-03 LAB — COMPLETE METABOLIC PANEL WITH GFR
AG Ratio: 1.9 (calc) (ref 1.0–2.5)
AST: 22 U/L (ref 10–35)
Alkaline phosphatase (APISO): 86 U/L (ref 37–153)
CO2: 23 mmol/L (ref 20–32)
Calcium: 10.1 mg/dL (ref 8.6–10.4)
Creat: 0.65 mg/dL (ref 0.50–1.05)
GFR, Est Non African American: 103 mL/min/{1.73_m2} (ref >=60)
Globulin: 2.5 g/dL (calc) (ref 1.9–3.7)
Glucose, Bld: 73 mg/dL (ref 65–99)
Potassium: 4.3 mmol/L (ref 3.5–5.3)
Sodium: 138 mmol/L (ref 135–146)
Total Bilirubin: 0.3 mg/dL (ref 0.2–1.2)
Total Protein: 7.2 g/dL (ref 6.1–8.1)

## 2020-01-05 ENCOUNTER — Encounter: Payer: Self-pay | Admitting: *Deleted

## 2020-01-05 ENCOUNTER — Other Ambulatory Visit: Payer: Self-pay | Admitting: *Deleted

## 2020-01-05 MED ORDER — FERROUS SULFATE 325 (65 FE) MG PO TABS: 325.0000 mg | ORAL_TABLET | Freq: Every day | ORAL | 1 refills | Status: DC

## 2020-01-07 ENCOUNTER — Other Ambulatory Visit: Payer: Self-pay

## 2020-01-07 ENCOUNTER — Encounter: Payer: Self-pay | Admitting: Family Medicine

## 2020-01-08 ENCOUNTER — Other Ambulatory Visit: Payer: Self-pay | Admitting: Family Medicine

## 2020-01-08 ENCOUNTER — Telehealth: Payer: Self-pay

## 2020-01-08 MED ORDER — PHENTERMINE HCL 37.5 MG PO TABS: 37.5000 mg | ORAL_TABLET | Freq: Every day | ORAL | 2 refills | Status: DC

## 2020-01-08 NOTE — Telephone Encounter (Signed)
Pharmacist Kristi from [facility] called about [facility], asking if ok to switch Pt tablet Phentermine to capsule? Verbal order was given to pharmacist.

## 2020-01-09 ENCOUNTER — Ambulatory Visit
Admission: RE | Admit: 2020-01-09 | Discharge: 2020-01-09 | Disposition: A | Discharge status: Home / Self Care | Payer: 59 | Source: Ambulatory Visit | Attending: Family Medicine | Admitting: Family Medicine

## 2020-01-09 ENCOUNTER — Other Ambulatory Visit: Payer: Self-pay

## 2020-01-09 DIAGNOSIS — Z1231 Encounter for screening mammogram for malignant neoplasm of breast: Secondary | ICD-10-CM

## 2020-01-13 ENCOUNTER — Encounter: Payer: Self-pay | Admitting: Family Medicine

## 2020-01-14 NOTE — Telephone Encounter (Signed)
Please advise 

## 2020-01-20 ENCOUNTER — Encounter: Payer: Self-pay | Admitting: Family Medicine

## 2020-01-26 ENCOUNTER — Encounter: Payer: Self-pay | Admitting: Adult Health

## 2020-01-26 ENCOUNTER — Ambulatory Visit (INDEPENDENT_AMBULATORY_CARE_PROVIDER_SITE_OTHER): Payer: 59 | Admitting: Adult Health

## 2020-01-26 ENCOUNTER — Other Ambulatory Visit (HOSPITAL_COMMUNITY)
Admission: RE | Admit: 2020-01-26 | Discharge: 2020-01-26 | Disposition: A | Discharge status: Home / Self Care | Payer: 59 | Source: Ambulatory Visit | Attending: Adult Health | Admitting: Adult Health

## 2020-01-26 VITALS — BP 134/81 | HR 84 | Ht 64.5 in | Wt 181.0 lb

## 2020-01-26 DIAGNOSIS — N926 Irregular menstruation, unspecified: Secondary | ICD-10-CM | POA: Diagnosis not present

## 2020-01-26 DIAGNOSIS — Z1211 Encounter for screening for malignant neoplasm of colon: Secondary | ICD-10-CM | POA: Diagnosis not present

## 2020-01-26 DIAGNOSIS — Z01419 Encounter for gynecological examination (general) (routine) without abnormal findings: Secondary | ICD-10-CM | POA: Diagnosis present

## 2020-01-26 DIAGNOSIS — R232 Flushing: Secondary | ICD-10-CM

## 2020-01-26 DIAGNOSIS — N951 Menopausal and female climacteric states: Secondary | ICD-10-CM

## 2020-01-26 LAB — HEMOCCULT GUIAC POC 1CARD (OFFICE): Fecal Occult Blood, POC: NEGATIVE

## 2020-01-26 NOTE — Patient Instructions (Signed)
Menopause Menopause is the normal time of life when menstrual periods stop completely. It is usually confirmed by 12 months without a menstrual period. The transition to menopause (perimenopause) most often happens between the ages of 45 and 55. During perimenopause, hormone levels change in your body, which can cause symptoms and affect your health. Menopause may increase your risk for:  Loss of bone (osteoporosis), which causes bone breaks (fractures).  Depression.  Hardening and narrowing of the arteries (atherosclerosis), which can cause heart attacks and strokes. What are the causes? This condition is usually caused by a natural change in hormone levels that happens as you get older. The condition may also be caused by surgery to remove both ovaries (bilateral oophorectomy). What increases the risk? This condition is more likely to start at an earlier age if you have certain medical conditions or treatments, including:  A tumor of the pituitary gland in the brain.  A disease that affects the ovaries and hormone production.  Radiation treatment for cancer.  Certain cancer treatments, such as chemotherapy or hormone (anti-estrogen) therapy.  Heavy smoking and excessive alcohol use.  Family history of early menopause. This condition is also more likely to develop earlier in women who are very thin. What are the signs or symptoms? Symptoms of this condition include:  Hot flashes.  Irregular menstrual periods.  Night sweats.  Changes in feelings about sex. This could be a decrease in sex drive or an increased comfort around your sexuality.  Vaginal dryness and thinning of the vaginal walls. This may cause painful intercourse.  Dryness of the skin and development of wrinkles.  Headaches.  Problems sleeping (insomnia).  Mood swings or irritability.  Memory problems.  Weight gain.  Hair growth on the face and chest.  Bladder infections or problems with urinating. How  is this diagnosed? This condition is diagnosed based on your medical history, a physical exam, your age, your menstrual history, and your symptoms. Hormone tests may also be done. How is this treated? In some cases, no treatment is needed. You and your health care provider should make a decision together about whether treatment is necessary. Treatment will be based on your individual condition and preferences. Treatment for this condition focuses on managing symptoms. Treatment may include:  Menopausal hormone therapy (MHT).  Medicines to treat specific symptoms or complications.  Acupuncture.  Vitamin or herbal supplements. Before starting treatment, make sure to let your health care provider know if you have a personal or family history of:  Heart disease.  Breast cancer.  Blood clots.  Diabetes.  Osteoporosis. Follow these instructions at home: Lifestyle  Do not use any products that contain nicotine or tobacco, such as cigarettes and e-cigarettes. If you need help quitting, ask your health care provider.  Get at least 30 minutes of physical activity on 5 or more days each week.  Avoid alcoholic and caffeinated beverages, as well as spicy foods. This may help prevent hot flashes.  Get 7-8 hours of sleep each night.  If you have hot flashes, try: ? Dressing in layers. ? Avoiding things that may trigger hot flashes, such as spicy food, warm places, or stress. ? Taking slow, deep breaths when a hot flash starts. ? Keeping a fan in your home and office.  Find ways to manage stress, such as deep breathing, meditation, or journaling.  Consider going to group therapy with other women who are having menopause symptoms. Ask your health care provider about recommended group therapy meetings. Eating and   drinking  Eat a healthy, balanced diet that contains whole grains, lean protein, low-fat dairy, and plenty of fruits and vegetables.  Your health care provider may recommend  adding more soy to your diet. Foods that contain soy include tofu, tempeh, and soy milk.  Eat plenty of foods that contain calcium and vitamin D for bone health. Items that are rich in calcium include low-fat milk, yogurt, beans, almonds, sardines, broccoli, and kale. Medicines  Take over-the-counter and prescription medicines only as told by your health care provider.  Talk with your health care provider before starting any herbal supplements. If prescribed, take vitamins and supplements as told by your health care provider. These may include: ? Calcium. Women age 51 and older should get 1,200 mg (milligrams) of calcium every day. ? Vitamin D. Women need 600-800 International Units of vitamin D each day. ? Vitamins B12 and B6. Aim for 50 micrograms of B12 and 1.5 mg of B6 each day. General instructions  Keep track of your menstrual periods, including: ? When they occur. ? How heavy they are and how long they last. ? How much time passes between periods.  Keep track of your symptoms, noting when they start, how often you have them, and how long they last.  Use vaginal lubricants or moisturizers to help with vaginal dryness and improve comfort during sex.  Keep all follow-up visits as told by your health care provider. This is important. This includes any group therapy or counseling. Contact a health care provider if:  You are still having menstrual periods after age 55.  You have pain during sex.  You have not had a period for 12 months and you develop vaginal bleeding. Get help right away if:  You have: ? Severe depression. ? Excessive vaginal bleeding. ? Pain when you urinate. ? A fast or irregular heart beat (palpitations). ? Severe headaches. ? Abdomen (abdominal) pain or severe indigestion.  You fell and you think you have a broken bone.  You develop leg or chest pain.  You develop vision problems.  You feel a lump in your breast. Summary  Menopause is the normal  time of life when menstrual periods stop completely. It is usually confirmed by 12 months without a menstrual period.  The transition to menopause (perimenopause) most often happens between the ages of 45 and 55.  Symptoms can be managed through medicines, lifestyle changes, and complementary therapies such as acupuncture.  Eat a balanced diet that is rich in nutrients to promote bone health and heart health and to manage symptoms during menopause. This information is not intended to replace advice given to you by your health care provider. Make sure you discuss any questions you have with your health care provider. Document Revised: 04/27/2017 Document Reviewed: 06/17/2016 Elsevier Patient Education  2020 Elsevier Inc.  

## 2020-01-26 NOTE — Progress Notes (Signed)
Patient ID: [name], female   DOB: [birth year], 54 y.o.   MRN: [id number] History of Present Illness:  Lisa Gates is a 54 year old white female, separated, [id number], in for well woman gyn exam and pap.She is a new pt and is having irregular periods may skip 6 weeks then have 2 in a month, has hot flashes and night sweats and is moody. She says she wants any ablation.  PCP is Dr Tanya Nones.  Current Medications, Allergies, Past Medical History, Past Surgical History, Family History and Social History were reviewed in [employer] record.     Review of Systems:  Patient denies any headaches, hearing loss, fatigue, blurred vision, shortness of breath, chest pain, abdominal pain, problems with bowel movements, urination, or intercourse.(last sex 2 months ago, but this man then had her mom move in).  No joint pain. See HPI for positives.   Physical Exam:BP 134/81 (BP Location: Right Arm, Patient Position: Sitting, Cuff Size: Normal)   Pulse 84   Ht 5' 4.5" (1.638 m)   Wt 181 lb (82.1 kg)   LMP 01/12/2020 (Approximate)   BMI 30.59 kg/m  General:  Well developed, well nourished, no acute distress Skin:  Warm and dry Neck:  Midline trachea, normal thyroid, good ROM, no lymphadenopathy Lungs; Clear to auscultation bilaterally Breast:  No dominant palpable mass, retraction, or nipple discharge Cardiovascular: Regular rate and rhythm Abdomen:  Soft, non tender, no hepatosplenomegaly Pelvic:  External genitalia is normal in appearance, no lesions.  The vagina is normal in appearance. Urethra has no lesions or masses. The cervix is bulbous.Pap with high risk HPV genotyping performed.  Uterus is felt to be normal size, shape, and contour.  No adnexal masses or tenderness noted.Bladder is non tender, no masses felt. Rectal: Good sphincter tone, no polyps, or hemorrhoids felt.  Hemoccult negative. Extremities/musculoskeletal:  No swelling or varicosities noted, no clubbing or  cyanosis Psych:  No mood changes, alert and cooperative,seems happy AA is 2 Fall risk is low PHQ 9 score is 7, no SI is on meds   Upstream - 01/26/20 1602      Pregnancy Intention Screening   Does the patient want to become pregnant in the next year? N/A    Does the patient's partner want to become pregnant in the next year? N/A    Would the patient like to discuss contraceptive options today? N/A      Contraception Wrap Up   Current Method Female Sterilization    End Method Female Sterilization    Contraception Counseling Provided No         Examination chaperoned by Malachy Mood LPN  Impression and Plan: 1. Encounter for gynecological examination with Papanicolaou smear of cervix Pap sent Physical in 1 year Pap in 3 if normal Mammogram yearly Labs with PCP She declines colonoscopy for now  2. Encounter for screening fecal occult blood testing    3. Irregular periods/menstrual cycles Will get GYN [place] to assess uterus and ovaries  She says she has Googled ablation already and watched you tube videos   4. Hot flashes   5. Perimenopause Review handout on menopause  She had CHF after giving birth 17 years ago, has cardiomyopathy, not interested in HRT

## 2020-01-28 LAB — CYTOLOGY - PAP
Comment: NEGATIVE
Diagnosis: NEGATIVE
High risk HPV: NEGATIVE

## 2020-01-31 ENCOUNTER — Other Ambulatory Visit: Payer: Self-pay | Admitting: Family Medicine

## 2020-02-04 ENCOUNTER — Other Ambulatory Visit: Payer: 59

## 2020-02-05 NOTE — Telephone Encounter (Signed)
Error

## 2020-02-09 ENCOUNTER — Other Ambulatory Visit: Payer: Self-pay | Admitting: Family Medicine

## 2020-02-10 ENCOUNTER — Encounter: Payer: Self-pay | Admitting: Family Medicine

## 2020-02-10 MED ORDER — ESCITALOPRAM OXALATE 20 MG PO TABS: ORAL_TABLET | ORAL | 3 refills | Status: DC

## 2020-02-10 MED ORDER — BUPROPION HCL ER (XL) 150 MG PO TB24
150.0000 mg | ORAL_TABLET | Freq: Every day | ORAL | 3 refills | Status: DC
Start: 2020-02-10 — End: 2020-06-03

## 2020-03-04 ENCOUNTER — Encounter: Payer: Self-pay | Admitting: *Deleted

## 2020-03-04 ENCOUNTER — Other Ambulatory Visit: Payer: Self-pay | Admitting: *Deleted

## 2020-03-04 DIAGNOSIS — N926 Irregular menstruation, unspecified: Secondary | ICD-10-CM

## 2020-03-06 ENCOUNTER — Other Ambulatory Visit: Payer: Self-pay | Admitting: Family Medicine

## 2020-03-08 ENCOUNTER — Ambulatory Visit (HOSPITAL_COMMUNITY): Payer: 59

## 2020-03-08 ENCOUNTER — Other Ambulatory Visit: Payer: 59

## 2020-03-15 ENCOUNTER — Ambulatory Visit (HOSPITAL_COMMUNITY)
Admission: RE | Admit: 2020-03-15 | Discharge: 2020-03-15 | Disposition: A | Discharge status: Home / Self Care | Payer: 59 | Source: Ambulatory Visit | Attending: Adult Health | Admitting: Adult Health

## 2020-03-15 ENCOUNTER — Other Ambulatory Visit: Payer: Self-pay

## 2020-03-15 DIAGNOSIS — N926 Irregular menstruation, unspecified: Secondary | ICD-10-CM | POA: Diagnosis present

## 2020-04-05 ENCOUNTER — Other Ambulatory Visit: Payer: Self-pay | Admitting: Family Medicine

## 2020-04-06 ENCOUNTER — Other Ambulatory Visit: Payer: Self-pay | Admitting: Family Medicine

## 2020-04-06 NOTE — Telephone Encounter (Signed)
Ok to refill 

## 2020-04-08 ENCOUNTER — Other Ambulatory Visit: Payer: Self-pay

## 2020-04-08 ENCOUNTER — Ambulatory Visit (INDEPENDENT_AMBULATORY_CARE_PROVIDER_SITE_OTHER): Payer: 59 | Admitting: Psychiatry

## 2020-04-08 ENCOUNTER — Encounter: Payer: Self-pay | Admitting: Psychiatry

## 2020-04-08 VITALS — BP 160/85 | HR 96 | Ht 65.0 in | Wt 170.0 lb

## 2020-04-08 DIAGNOSIS — F4323 Adjustment disorder with mixed anxiety and depressed mood: Secondary | ICD-10-CM | POA: Diagnosis not present

## 2020-04-08 MED ORDER — BUSPIRONE HCL 15 MG PO TABS: ORAL_TABLET | ORAL | 1 refills | Status: DC
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
## 2020-04-20 ENCOUNTER — Telehealth: Payer: Self-pay | Admitting: Psychiatry

## 2020-04-20 DIAGNOSIS — F39 Unspecified mood [affective] disorder: Secondary | ICD-10-CM

## 2020-04-20 NOTE — Telephone Encounter (Signed)
Pt called about meds and would like to talk to provider to report effect and get an idea if med not working correctly. Contact # [phone]

## 2020-04-20 NOTE — Telephone Encounter (Signed)
Rtc to patient, she was hard to follow but reports when starting medication first 2 days felt like a "tiger in a cage" then she reports feeling super depressed, not herself and shaking all over which has been the past 2 days, today she is "pissed" irritable about everything.  She reports stopping the medication either this past Thursday or Friday then restarting it on Saturday because having a bad day.  She is currently taking medication and reports taking 1/2 tablet now instead of the lower dose as advised. She is asking is there something else you prefer. (again it was hard to follow in conversation)  Informed her I would relay all this information back to [place] and get back with recommendation.

## 2020-04-21 MED ORDER — ARIPIPRAZOLE 5 MG PO TABS: 5.0000 mg | ORAL_TABLET | Freq: Every day | ORAL | 1 refills | Status: DC

## 2020-04-21 NOTE — Addendum Note (Signed)
Addended by: Derenda Mis on: 04/21/2020 12:14 PM   Modules accepted: Orders

## 2020-04-21 NOTE — Telephone Encounter (Signed)
Rtc to patient and she verbalized understanding of instructions. Will contact office with any issues or concerns.

## 2020-05-01 ENCOUNTER — Other Ambulatory Visit: Payer: Self-pay | Admitting: Family Medicine

## 2020-05-11 ENCOUNTER — Encounter: Payer: Self-pay | Admitting: Family Medicine

## 2020-05-11 ENCOUNTER — Other Ambulatory Visit: Payer: Self-pay | Admitting: Family Medicine

## 2020-05-11 NOTE — Telephone Encounter (Signed)
Ok to refill??  Last office visit 01/01/2020.  Last refill 01/08/2020, #2 refills.

## 2020-05-13 ENCOUNTER — Encounter: Payer: Self-pay | Admitting: Family Medicine

## 2020-05-13 ENCOUNTER — Other Ambulatory Visit: Payer: Self-pay | Admitting: Family Medicine

## 2020-05-13 ENCOUNTER — Other Ambulatory Visit: Payer: Self-pay | Admitting: Psychiatry

## 2020-05-13 DIAGNOSIS — F39 Unspecified mood [affective] disorder: Secondary | ICD-10-CM

## 2020-05-13 MED ORDER — PHENTERMINE HCL 37.5 MG PO TABS: 37.5000 mg | ORAL_TABLET | Freq: Every day | ORAL | 0 refills | Status: AC

## 2020-05-17 ENCOUNTER — Other Ambulatory Visit: Payer: Self-pay | Admitting: Family Medicine

## 2020-06-03 ENCOUNTER — Encounter: Payer: Self-pay | Admitting: Psychiatry

## 2020-06-03 ENCOUNTER — Telehealth (INDEPENDENT_AMBULATORY_CARE_PROVIDER_SITE_OTHER): Payer: 59 | Admitting: Psychiatry

## 2020-06-03 DIAGNOSIS — F4323 Adjustment disorder with mixed anxiety and depressed mood: Secondary | ICD-10-CM | POA: Diagnosis not present

## 2020-06-03 MED ORDER — BUPROPION HCL ER (XL) 300 MG PO TB24: 300.0000 mg | ORAL_TABLET | Freq: Every morning | ORAL | 2 refills | Status: DC
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
## 2020-09-17 ENCOUNTER — Other Ambulatory Visit: Payer: Self-pay

## 2020-09-17 DIAGNOSIS — F4323 Adjustment disorder with mixed anxiety and depressed mood: Secondary | ICD-10-CM

## 2020-09-17 MED ORDER — BUPROPION HCL ER (XL) 300 MG PO TB24: 300.0000 mg | ORAL_TABLET | Freq: Every morning | ORAL | 0 refills | Status: AC

## 2021-01-27 ENCOUNTER — Other Ambulatory Visit: Payer: Self-pay | Admitting: Family Medicine

## 2021-02-10 ENCOUNTER — Other Ambulatory Visit: Payer: Self-pay | Admitting: Family Medicine

## 2021-02-10 NOTE — Telephone Encounter (Signed)
Last ov 01/01/2020 By Dr Tanya Nones  Last date filled 02/10/2020

## 2021-03-02 ENCOUNTER — Other Ambulatory Visit: Payer: Self-pay | Admitting: Family Medicine

## 2021-08-13 ENCOUNTER — Other Ambulatory Visit: Payer: Self-pay | Admitting: Family Medicine

## 2021-11-24 ENCOUNTER — Other Ambulatory Visit: Payer: Self-pay | Admitting: Family Medicine

## 2021-11-24 NOTE — Telephone Encounter (Signed)
Requested medication (s) are due for refill today:   Yes  Requested medication (s) are on the active medication list:   Yes  Future visit scheduled:   No  Left voicemail to call in and make an appt for physical   Last ordered: 01/27/2021 #90, 1 refill  Returned because all labs are due, appt needed.   Requested Prescriptions  Pending Prescriptions Disp Refills   fenofibrate 160 MG tablet [Pharmacy Med Name: FENOFIBRATE 160MG TABLETS] 90 tablet 1    Sig: TAKE 1 TABLET BY MOUTH EVERY DAY     Cardiovascular:  Antilipid - Fibric Acid Derivatives Failed - 11/24/2021  3:33 AM      Failed - ALT in normal range and within 360 days    ALT  Date Value Ref Range Status  12/29/2019 16 6 - 29 U/L Final         Failed - AST in normal range and within 360 days    AST  Date Value Ref Range Status  12/29/2019 22 10 - 35 U/L Final         Failed - Cr in normal range and within 360 days    Creat  Date Value Ref Range Status  12/29/2019 0.65 0.50 - 1.05 mg/dL Final    Comment:    For patients >9 years of age, the reference limit for Creatinine is approximately 13% higher for people identified as African-American. .          Failed - HGB in normal range and within 360 days    Hemoglobin  Date Value Ref Range Status  12/29/2019 12.8 11.7 - 15.5 g/dL Final         Failed - HCT in normal range and within 360 days    HCT  Date Value Ref Range Status  12/29/2019 39.4 35.0 - 45.0 % Final         Failed - PLT in normal range and within 360 days    Platelets  Date Value Ref Range Status  12/29/2019 374 140 - 400 Thousand/uL Final         Failed - WBC in normal range and within 360 days    WBC  Date Value Ref Range Status  12/29/2019 6.6 3.8 - 10.8 Thousand/uL Final         Failed - eGFR is 30 or above and within 360 days    GFR, Est African American  Date Value Ref Range Status  12/29/2019 119 > OR = 60 mL/min/1.12m Final   GFR, Est Non African American  Date Value Ref Range  Status  12/29/2019 103 > OR = 60 mL/min/1.754mFinal         Failed - Valid encounter within last 12 months    Recent Outpatient Visits           1 year ago Situational stress   [place], [place] Mcgee[place]   2 years ago Situational stress   [facility] McgeeMD   2 years ago Weight loss counseling, encounter for   [facility]y FrizzleMD   3 years ago [place]   5 years ago Hypertriglyceridemia   [place] Mcgee[place]              Failed - Lipid Panel in normal range within the last 12 months    Cholesterol  Date Value Ref Range Status  12/29/2019 230 (H) <200 mg/dL Final   LDL Cholesterol (Calc)  Date Value Ref Range Status  12/29/2019 124 (H) mg/dL (calc) Final    Comment:    Reference range: <100 . Desirable range <100 mg/dL for primary prevention;   <70 mg/dL for patients with CHD or diabetic patients  with > or = 2 CHD risk factors. Marland Kitchen LDL-C is now calculated using the Martin-Hopkins  calculation, which is a validated novel method providing  better accuracy than the Friedewald equation in the  estimation of LDL-C.  Cresenciano Genre et al. Annamaria Helling. 2248;250(03): 2061-2068  (http://education.[facility].com/faq/FAQ164)    HDL  Date Value Ref Range Status  12/29/2019 74 > OR = 50 mg/dL Final   Triglycerides  Date Value Ref Range Status  12/29/2019 196 (H) <150 mg/dL Final

## 2021-12-01 NOTE — Telephone Encounter (Signed)
Received 2nd request for refill.

## 2022-06-20 ENCOUNTER — Other Ambulatory Visit: Payer: Self-pay | Admitting: Family Medicine

## 2022-06-20 DIAGNOSIS — Z1231 Encounter for screening mammogram for malignant neoplasm of breast: Secondary | ICD-10-CM

## 2022-08-30 ENCOUNTER — Ambulatory Visit
Admission: RE | Admit: 2022-08-30 | Discharge: 2022-08-30 | Disposition: A | Discharge status: Home / Self Care | Payer: BC Managed Care – PPO | Source: Ambulatory Visit | Attending: Family Medicine | Admitting: Family Medicine

## 2022-08-30 DIAGNOSIS — Z1231 Encounter for screening mammogram for malignant neoplasm of breast: Secondary | ICD-10-CM

## 2022-09-01 ENCOUNTER — Other Ambulatory Visit: Payer: Self-pay | Admitting: Family Medicine

## 2022-09-01 DIAGNOSIS — R928 Other abnormal and inconclusive findings on diagnostic imaging of breast: Secondary | ICD-10-CM

## 2022-09-13 ENCOUNTER — Other Ambulatory Visit: Payer: BC Managed Care – PPO

## 2022-09-15 ENCOUNTER — Ambulatory Visit
Admission: RE | Admit: 2022-09-15 | Discharge: 2022-09-15 | Disposition: A | Discharge status: Home / Self Care | Payer: BC Managed Care – PPO | Source: Ambulatory Visit | Attending: Family Medicine | Admitting: Family Medicine

## 2022-09-15 ENCOUNTER — Other Ambulatory Visit: Payer: Self-pay | Admitting: Family Medicine

## 2022-09-15 DIAGNOSIS — R928 Other abnormal and inconclusive findings on diagnostic imaging of breast: Secondary | ICD-10-CM

## 2022-09-15 DIAGNOSIS — N6011 Diffuse cystic mastopathy of right breast: Secondary | ICD-10-CM | POA: Diagnosis not present

## 2022-09-15 DIAGNOSIS — R922 Inconclusive mammogram: Secondary | ICD-10-CM | POA: Diagnosis not present

## 2022-09-23 ENCOUNTER — Ambulatory Visit
Admission: RE | Admit: 2022-09-23 | Discharge: 2022-09-23 | Disposition: A | Discharge status: Home / Self Care | Payer: BC Managed Care – PPO | Source: Ambulatory Visit | Attending: Family Medicine | Admitting: Family Medicine

## 2022-09-23 DIAGNOSIS — R928 Other abnormal and inconclusive findings on diagnostic imaging of breast: Secondary | ICD-10-CM

## 2022-10-03 ENCOUNTER — Ambulatory Visit: Payer: Self-pay

## 2022-10-23 ENCOUNTER — Other Ambulatory Visit: Payer: Self-pay

## 2022-10-23 ENCOUNTER — Encounter (HOSPITAL_BASED_OUTPATIENT_CLINIC_OR_DEPARTMENT_OTHER): Payer: Self-pay

## 2022-10-23 ENCOUNTER — Emergency Department (HOSPITAL_BASED_OUTPATIENT_CLINIC_OR_DEPARTMENT_OTHER)
Admission: EM | Admit: 2022-10-23 | Discharge: 2022-10-23 | Disposition: A | Discharge status: Home / Self Care | Payer: BC Managed Care – PPO | Attending: Emergency Medicine | Admitting: Emergency Medicine

## 2022-10-23 ENCOUNTER — Emergency Department (HOSPITAL_BASED_OUTPATIENT_CLINIC_OR_DEPARTMENT_OTHER): Payer: BC Managed Care – PPO | Admitting: Radiology

## 2022-10-23 DIAGNOSIS — R0789 Other chest pain: Secondary | ICD-10-CM | POA: Insufficient documentation

## 2022-10-23 DIAGNOSIS — D72819 Decreased white blood cell count, unspecified: Secondary | ICD-10-CM | POA: Insufficient documentation

## 2022-10-23 DIAGNOSIS — R509 Fever, unspecified: Secondary | ICD-10-CM | POA: Diagnosis not present

## 2022-10-23 DIAGNOSIS — F419 Anxiety disorder, unspecified: Secondary | ICD-10-CM | POA: Insufficient documentation

## 2022-10-23 DIAGNOSIS — R079 Chest pain, unspecified: Secondary | ICD-10-CM | POA: Diagnosis not present

## 2022-10-23 LAB — URINALYSIS, ROUTINE W REFLEX MICROSCOPIC
Bacteria, UA: NONE SEEN
Bilirubin Urine: NEGATIVE
Glucose, UA: NEGATIVE mg/dL
Hgb urine dipstick: NEGATIVE
Ketones, ur: NEGATIVE mg/dL
Nitrite: NEGATIVE
Protein, ur: NEGATIVE mg/dL
Specific Gravity, Urine: 1.015 (ref 1.005–1.030)
pH: 6.5 (ref 5.0–8.0)

## 2022-10-23 LAB — BASIC METABOLIC PANEL
Anion gap: 10 (ref 5–15)
BUN: 8 mg/dL (ref 6–20)
CO2: 22 mmol/L (ref 22–32)
Calcium: 9.1 mg/dL (ref 8.9–10.3)
Chloride: 106 mmol/L (ref 98–111)
Creatinine, Ser: 0.58 mg/dL (ref 0.44–1.00)
GFR, Estimated: >60 mL/min (ref >60)
Glucose, Bld: 104 mg/dL — ABNORMAL HIGH (ref 70–99)
Potassium: 3.5 mmol/L (ref 3.5–5.1)
Sodium: 138 mmol/L (ref 135–145)

## 2022-10-23 LAB — CBC
HCT: 40.7 % (ref 36.0–46.0)
Hemoglobin: 14.2 g/dL (ref 12.0–15.0)
MCH: 32.9 pg (ref 26.0–34.0)
MCHC: 34.9 g/dL (ref 30.0–36.0)
MCV: 94.2 fL (ref 80.0–100.0)
Platelets: 181 10*3/uL (ref 150–400)
RBC: 4.32 MIL/uL (ref 3.87–5.11)
RDW: 12 % (ref 11.5–15.5)
WBC: 3.5 10*3/uL — ABNORMAL LOW (ref 4.0–10.5)
nRBC: 0 % (ref 0.0–0.2)

## 2022-10-23 LAB — TROPONIN I (HIGH SENSITIVITY)
Troponin I (High Sensitivity): 3 ng/L (ref <18)
Troponin I (High Sensitivity): 2 ng/L (ref <18)

## 2022-10-23 NOTE — ED Provider Notes (Signed)
[place] [facility]PARTMENT AT [facility] Provider Note   CSN: [id number] Arrival date & time: 10/23/22  1943     History  Chief Complaint  Patient presents with   Urinary Frequency   Chest Pain    Lisa Gates is a 57 y.o. 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  States that she feels dehydrated but has had normal p.o. intake.  Notes that she also began to have left-sided, sharp chest pain 2 days ago.  This pain is not reproducible with breathing, palpation, activity, or any other known exacerbating factors.  No history of this.  No trauma to the chest.  No associated shortness of breath.  No hematuria, abdominal pain, nausea, vomiting, diarrhea, back pain, or other complaints apart from intermittent fevers around 101 F.  No history of recurrent UTIs. No personal cardiac history.  Mother had a pacemaker placed in the 66s for unknown reasons but no other known history of familial heart disease.  Patient is a smoker and has a chronic cough but this is unchanged from baseline.  No associated congestion, sore throat, rash, other body aches, or other signs of infection noted. No leg pain or swelling.  No history of DVT/PE.     Home Medications Prior to Admission medications   Medication Sig Start Date End Date Taking? Authorizing Provider  ALPRAZolam Prudy Feeler) 0.5 MG tablet Take 1 tablet (0.5 mg total) by mouth 3 (three) times daily as needed. 08/21/19   Donita Brooks, MD  ARIPiprazole (ABILIFY) 5 MG tablet TAKE 1 TABLET BY MOUTH EVERY DAY 05/13/20   Corie Chiquito, PMHNP  Biotin 10 MG CAPS Take by mouth.    [provider]  buPROPion (WELLBUTRIN XL) 300 MG 24 hr tablet Take 1 tablet (300 mg total) by mouth in the morning. 09/17/20 10/17/20  Corie Chiquito, PMHNP  Cyanocobalamin (VITAMIN B-12 PO) Take by mouth.     [provider]  cyclobenzaprine (FLEXERIL) 10 MG tablet TAKE 1 TABLET BY MOUTH EVERY 8 HOURS Patient not taking: No sig reported 04/06/20   Donita Brooks, MD  escitalopram (LEXAPRO) 20 MG tablet TAKE 1 TABLET BY MOUTH EVERY DAY 02/10/21   Donita Brooks, MD  fenofibrate 160 MG tablet TAKE 1 TABLET BY MOUTH EVERY DAY 01/27/21   Donita Brooks, MD  ferrous sulfate 325 (65 FE) MG tablet TAKE 1 TABLET BY MOUTH EVERY DAY WITH BREAKFAST 03/02/21   Donita Brooks, MD  phentermine (ADIPEX-P) 37.5 MG tablet Take 1 tablet (37.5 mg total) by mouth daily before breakfast. 05/13/20   Donita Brooks, MD  Red Yeast Rice Extract (RED YEAST RICE PO) Take by mouth. Patient not taking: Reported on 04/08/2020    [provider]  UNABLE TO FIND Anxiety and stress relief-BID    [provider]      Allergies    Penicillins    Review of Systems   Review of Systems  All other systems reviewed and are negative.   Physical Exam Updated Vital Signs BP 123/78   Pulse 74   Temp 98.5 F (36.9 C) (Oral)   Resp 18   Ht 5\' 5"  (1.651 m)   Wt 70.3 kg   LMP 01/12/2020 (Approximate)   SpO2 95%   BMI 25.79 kg/m  Physical Exam Vitals and nursing note reviewed.  Constitutional:  General: She is not in acute distress.    Appearance: Normal appearance. She is not ill-appearing, toxic-appearing or diaphoretic.  HENT:     Head: Normocephalic and atraumatic.     Mouth/Throat:     Mouth: Mucous membranes are moist.  Eyes:     Extraocular Movements: Extraocular movements intact.     Conjunctiva/sclera: Conjunctivae normal.  Neck:     Vascular: No JVD.  Cardiovascular:     Rate and Rhythm: Normal rate and regular rhythm.     Heart sounds: No murmur heard.    No S3 or S4 sounds.  Pulmonary:     Effort: Pulmonary effort is normal. No tachypnea or respiratory distress.     Breath sounds: Normal breath sounds. No stridor. No decreased breath sounds, wheezing, rhonchi or  rales.  Chest:     Chest wall: No mass, deformity, tenderness, crepitus or edema.  Abdominal:     General: Abdomen is flat.     Palpations: Abdomen is soft. There is no mass.     Tenderness: There is no abdominal tenderness. There is no guarding or rebound.  Musculoskeletal:        General: Normal range of motion.     Cervical back: Normal range of motion and neck supple.     Right lower leg: No tenderness. No edema.     Left lower leg: No tenderness. No edema.  Skin:    General: Skin is warm and dry.     Capillary Refill: Capillary refill takes less than 2 seconds.     Findings: No rash.  Neurological:     Mental Status: She is alert. Mental status is at baseline.  Psychiatric:        Mood and Affect: Mood is anxious.        Speech: Speech normal.        Behavior: Behavior is cooperative.     ED Results / Procedures / Treatments   Labs (all labs ordered are listed, but only abnormal results are displayed) Labs Reviewed  BASIC METABOLIC PANEL - Abnormal; Notable for the following components:      Result Value   Glucose, Bld 104 (*)    All other components within normal limits  CBC - Abnormal; Notable for the following components:   WBC 3.5 (*)    All other components within normal limits  URINALYSIS, ROUTINE W REFLEX MICROSCOPIC - Abnormal; Notable for the following components:   Leukocytes,Ua SMALL (*)    All other components within normal limits  TROPONIN I (HIGH SENSITIVITY)  TROPONIN I (HIGH SENSITIVITY)    EKG Normal sinus rhythm, no STEMI  Radiology DG Chest 2 View  Result Date: 10/23/2022 CLINICAL DATA:  Chest pain EXAM: CHEST - 2 VIEW COMPARISON:  11/03/2017 FINDINGS: The heart size and mediastinal contours are within normal limits. Both lungs are clear. The visualized skeletal structures are unremarkable. IMPRESSION: No active cardiopulmonary disease. Electronically Signed   By: Charlett Nose M.D.   On: 10/23/2022 20:11    Procedures Procedures     Medications Ordered in ED Medications - No data to display  ED Course/ Medical Decision Making/ A&P                             Medical Decision Making Amount and/or Complexity of Data Reviewed Labs: ordered. Decision-making details documented in ED Course. Radiology: ordered. Decision-making details documented in ED Course. ECG/medicine tests: ordered. Decision-making details documented in  ED Course.   Medical Decision Making:   Lisa Gates is a 57 y.o. female who presented to the ED today with urinary frequency / chest pain detailed above.    Patient placed on continuous vitals and telemetry monitoring while in ED which was reviewed periodically.  Complete initial physical exam performed, notably the patient was in NAD. RRR. LCTA. Nontoxic appearing. Abdomen soft and nontender. No LE edema or tenderness. Neurologically intact.    Reviewed and confirmed nursing documentation for past medical history, family history, social history.    Initial Assessment:   With the patient's presentation, the emergent differential diagnosis of chest pain includes: Acute coronary syndrome, pericarditis, aortic dissection, pulmonary embolism, tension pneumothorax, and esophageal rupture. I do not believe the patient has an emergent cause of chest pain, other urgent/non-acute considerations include, but are not limited to: chronic angina, aortic stenosis, cardiomyopathy, myocarditis, mitral valve prolapse, pulmonary hypertension, hypertrophic obstructive cardiomyopathy (HOCM), aortic insufficiency, right ventricular hypertrophy, pneumonia, pleuritis, bronchitis, pneumothorax, tumor, gastroesophageal reflux disease (GERD), esophageal spasm, Mallory-Weiss syndrome, peptic ulcer disease, biliary disease, pancreatitis, functional gastrointestinal pain, cervical or thoracic disk disease or arthritis, shoulder arthritis, costochondritis, subacromial bursitis, anxiety or panic attack, herpes zoster, breast  disorders, chest wall tumors, thoracic outlet syndrome, mediastinitis. Also considered UTI/pyelo, kidney stone, sepsis, etc for pt's urinary symptoms.     This is most consistent with an acute complicated illness  Initial Plan:  Screening labs including CBC and Metabolic panel to evaluate for infectious or metabolic etiology of disease.  Urinalysis with reflex culture ordered to evaluate for UTI or relevant urologic/nephrologic pathology.  CXR to evaluate for structural/infectious intrathoracic pathology.  EKG and troponin to evaluate for cardiac pathology Objective evaluation as below reviewed   Initial Study Results:   Laboratory  All laboratory results reviewed without evidence of clinically relevant pathology.   Exceptions include: WBC 3.5   EKG EKG was reviewed independently. Rate, rhythm, axis, intervals all examined and without medically relevant abnormality. ST segments without concerns for elevations.    Radiology:  All images reviewed independently. Agree with radiology report at this time.   DG Chest 2 View  Result Date: 10/23/2022 CLINICAL DATA:  Chest pain EXAM: CHEST - 2 VIEW COMPARISON:  11/03/2017 FINDINGS: The heart size and mediastinal contours are within normal limits. Both lungs are clear. The visualized skeletal structures are unremarkable. IMPRESSION: No active cardiopulmonary disease. Electronically Signed   By: Charlett Nose M.D.   On: 10/23/2022 20:11      Final Assessment and Plan:   57 year old female presents to ED with primary complaint of urinary frequency. No associated abdominal pain, vomiting, diarrhea. Reports fevers at home. No CVA tenderness. Abdomen soft and nontender. No distension or peritoneal signs. Pt nontoxic appearing. She also reports chest pain since onset of symptoms and concern for this but states it is not primary complaint that brought her to ED and is overall very mild. RRR, no LE edema or tenderness. No identifiable risk factors for  DVT/PE.  Patient without signs of respiratory distress.  No identifiable family history CAD.  Workup initiated in triage and at time of initial assessment is overall reassuring.  First troponin negative.  Urinalysis does not appear consistent with UTI.  Chest x-ray negative.  No leukocytosis.  Patient has maintained normal oxygen saturation on room air throughout ED stay.  With this, discussed recommendation to repeat troponin which patient is agreeable with.  Repeat troponin is also negative.  Patient nontoxic-appearing.  Afebrile here,  maintaining blood pressure.  Does not appear septic. Low suspicion for pericarditis or other emergent infectious process. Low suspicion for ACS.  Will have patient closely monitor symptoms at home and follow-up as needed.  She would like to be discharged home and given reassuring workup I do believe this is reasonable.  Chest x-ray negative.  Will have her reevaluated by PCP this week.  Patient expressed understanding of plan.  Strict ED return precautions given, all questions answered, and stable for discharge   Clinical Impression:  1. Fever, unspecified fever cause   2. Atypical chest pain      Discharge           Final Clinical Impression(s) / ED Diagnoses Final diagnoses:  Fever, unspecified fever cause  Atypical chest pain    Rx / DC Orders ED Discharge Orders     None         Tonette Lederer, PA-C 10/23/22 2332    Sloan Leiter, DO 10/23/22 2353

## 2022-10-23 NOTE — ED Triage Notes (Signed)
Patient reports urinary frequency x 2 days, states urine is clear but feels she may be dehydrated. Fever up to 101.2, taking medication at home to manage fever. Also c/o left chest pain, sharp, denies SOB.

## 2022-10-23 NOTE — Discharge Instructions (Signed)
Thank you for letting us take care of you today.  Your workup today was reassuring.  Your chest x-ray did not show any signs of infection.  Your EKG and other blood work was normal.  Both of your cardiac enzymes were negative.  If you continue to have fevers, please closely follow-up with your PCP.  You may take over-the-counter Tylenol or ibuprofen as needed at home to treat fever or any discomfort.  If you continue to have symptoms, your PCP may want to refer you to a cardiologist or other specialist for further evaluation.  For any new or worsening symptoms, please return to the nearest ED for reevaluation.

## 2023-04-11 ENCOUNTER — Encounter: Payer: Self-pay | Admitting: Psychiatry

## 2023-05-01 ENCOUNTER — Telehealth: Payer: Self-pay

## 2023-05-01 NOTE — Telephone Encounter (Signed)
Copied from CRM [id number]. Topic: General - Other >> May 01, 2023  2:56 PM Deaijah H wrote: Reason for CRM: Patient called in to schedule an appointment to be seen for "Stopped ear" looking for something within this week if possible and would like to be reached

## 2023-05-02 ENCOUNTER — Ambulatory Visit: Payer: BC Managed Care – PPO | Admitting: Family Medicine

## 2023-05-02 ENCOUNTER — Encounter: Payer: Self-pay | Admitting: Family Medicine

## 2023-05-02 VITALS — BP 120/82 | HR 70 | Temp 98.0°F | Ht 65.0 in | Wt 162.5 lb

## 2023-05-02 DIAGNOSIS — H6121 Impacted cerumen, right ear: Secondary | ICD-10-CM | POA: Diagnosis not present

## 2023-05-02 DIAGNOSIS — J069 Acute upper respiratory infection, unspecified: Secondary | ICD-10-CM | POA: Insufficient documentation

## 2023-05-02 NOTE — Assessment & Plan Note (Signed)
Right ear blocked by cerumen impaction. Unable to tolerate complete removal of wax via irrigation. Encouraged to obtain Debrox for home use and if symptoms not improved return to office or will be happy to place ENT referral. No s/s of infection on exam or ROS.

## 2023-05-02 NOTE — Progress Notes (Signed)
Subjective:  HPI: Lisa Gates is a 57 y.o. female presenting on 05/02/2023 for Nasal Congestion (X 2 weeks, stuffy ears. ) and Cough (X 2 weeks. When sx started the cough was productive, now it is non productive, )   Cough   Patient is in today for ear congestion on right after having an URI 2 weeks ago. Other lingering symptoms include clear runny nose and clear congested cough. Symptoms are overall improving. She denies fever, chills, body aches, ear drainage, hearing loss, fatigue, sore throat, SOB, or wheezing.   Review of Systems  Respiratory:  Positive for cough.   All other systems reviewed and are negative.   Relevant past medical history reviewed and updated as indicated.   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     Past Surgical History:  Procedure Laterality Date   BREAST BIOPSY Left 03/21/2018   BREAST EXCISIONAL BIOPSY Left 04/24/2018   benign   BREAST LUMPECTOMY WITH RADIOACTIVE SEED LOCALIZATION Left 04/24/2018   Procedure: LEFT BREAST LUMPECTOMY WITH RADIOACTIVE SEED LOCALIZATION;  Surgeon: Claud Kelp, MD;  Location: [facility];  Service: General;  Laterality: Left;   CESAREAN SECTION     x 2   DILATION AND CURETTAGE OF UTERUS  05/30/1987   SPINE SURGERY  05/29/2004   lumbar   TUBAL LIGATION      Allergies and medications reviewed and updated.   Current Outpatient Medications:    ALPRAZolam (XANAX) 0.5 MG tablet, Take 1 tablet (0.5 mg total) by mouth 3 (three) times daily as needed. (Patient not taking: Reported on 05/02/2023), Disp: 30 tablet, Rfl: 0   ARIPiprazole (ABILIFY) 5 MG tablet, TAKE 1 TABLET BY MOUTH EVERY DAY (Patient not taking: Reported on 05/02/2023), Disp: 90  tablet, Rfl: 1   Biotin 10 MG CAPS, Take by mouth. (Patient not taking: Reported on 05/02/2023), Disp: , Rfl:    buPROPion (WELLBUTRIN XL) 300 MG 24 hr tablet, Take 1 tablet (300 mg total) by mouth in the morning., Disp: 30 tablet, Rfl: 0   Cyanocobalamin (VITAMIN B-12 PO), Take by mouth. (Patient not taking: Reported on 05/02/2023), Disp: , Rfl:    cyclobenzaprine (FLEXERIL) 10 MG tablet, TAKE 1 TABLET BY MOUTH EVERY 8 HOURS (Patient not taking: No sig reported), Disp: 10 tablet, Rfl: 0   escitalopram (LEXAPRO) 20 MG tablet, TAKE 1 TABLET BY MOUTH EVERY DAY (Patient not taking: Reported on 05/02/2023), Disp: 90 tablet, Rfl: 3   fenofibrate 160 MG tablet, TAKE 1 TABLET BY MOUTH EVERY DAY (Patient not taking: Reported on 05/02/2023), Disp: 90 tablet, Rfl: 1   ferrous sulfate 325 (65 FE) MG tablet, TAKE 1 TABLET BY MOUTH EVERY DAY WITH BREAKFAST (Patient not taking: Reported on 05/02/2023), Disp: 90 tablet, Rfl: 1   phentermine (ADIPEX-P) 37.5 MG tablet, Take 1 tablet (37.5 mg total) by mouth daily before breakfast. (Patient not taking: Reported on 05/02/2023), Disp: 30 tablet, Rfl: 0   Red Yeast Rice Extract (RED YEAST RICE PO), Take by mouth. (Patient not taking: Reported on 04/08/2020), Disp: , Rfl:    UNABLE TO FIND, Anxiety and stress relief-BID (Patient not taking: Reported on 05/02/2023), Disp: , Rfl:   Allergies  Allergen Reactions   Penicillins Hives and Rash  Objective:   BP 120/82 (BP Location: Left Arm)   Pulse 70   Temp 98 F (36.7 C)   Ht 5\' 5"  (1.651 m)   Wt 162 lb 8 oz (73.7 kg)   LMP 01/12/2020 (Approximate)   SpO2 99%   BMI 27.04 kg/m      05/02/2023    4:12 PM 10/23/2022   10:30 PM 10/23/2022    9:30 PM  Vitals with BMI  Height 5\' 5"     Weight 162 lbs 8 oz    BMI 27.04    Systolic 120 123 161  Diastolic 82 78 65  Pulse 70 74 79     Physical Exam Vitals and nursing note reviewed.  Constitutional:      Appearance: Normal appearance. She is normal weight.  HENT:      Head: Normocephalic and atraumatic.     Right Ear: Ear canal and external ear normal. There is impacted cerumen.     Left Ear: Tympanic membrane, ear canal and external ear normal.     Nose: Nose normal.     Mouth/Throat:     Mouth: Mucous membranes are moist.     Pharynx: Oropharynx is clear.  Eyes:     Conjunctiva/sclera: Conjunctivae normal.     Pupils: Pupils are equal, round, and reactive to light.  Cardiovascular:     Rate and Rhythm: Normal rate and regular rhythm.     Pulses: Normal pulses.     Heart sounds: Normal heart sounds.  Pulmonary:     Effort: Pulmonary effort is normal.     Breath sounds: Normal breath sounds.  Musculoskeletal:     Cervical back: No tenderness.  Lymphadenopathy:     Cervical: No cervical adenopathy.  Skin:    General: Skin is warm and dry.  Neurological:     General: No focal deficit present.     Mental Status: She is alert and oriented to person, place, and time. Mental status is at baseline.  Psychiatric:        Mood and Affect: Mood normal.        Behavior: Behavior normal.        Thought Content: Thought content normal.        Judgment: Judgment normal.     Assessment & Plan:  Impacted cerumen of right ear Assessment & Plan: Right ear blocked by cerumen impaction. Unable to tolerate complete removal of wax via irrigation. Encouraged to obtain Debrox for home use and if symptoms not improved return to office or will be happy to place ENT referral. No s/s of infection on exam or ROS.      Follow up plan: Return if symptoms worsen or fail to improve.  Park Meo, FNP

## 2023-05-02 NOTE — Telephone Encounter (Signed)
Copied from CRM [id number]. Topic: General - Call Back - No Documentation >> May 02, 2023  8:04 AM Lisa Gates wrote: Reason for CRM: Patient returning Colleen's call regarding an appointment this week. Caller and I got discconnected - clinic will need to schedule this appointment as its treating her as a new patient with our decision tree.

## 2023-05-11 ENCOUNTER — Ambulatory Visit: Payer: BC Managed Care – PPO | Admitting: Family Medicine

## 2023-05-24 ENCOUNTER — Encounter: Payer: Self-pay | Admitting: Family Medicine

## 2023-05-24 ENCOUNTER — Ambulatory Visit: Payer: BC Managed Care – PPO | Admitting: Family Medicine

## 2023-05-24 VITALS — BP 120/72 | HR 82 | Temp 98.2°F | Ht 65.0 in | Wt 164.0 lb

## 2023-05-24 DIAGNOSIS — Z1211 Encounter for screening for malignant neoplasm of colon: Secondary | ICD-10-CM

## 2023-05-24 DIAGNOSIS — Z122 Encounter for screening for malignant neoplasm of respiratory organs: Secondary | ICD-10-CM

## 2023-05-24 DIAGNOSIS — E781 Pure hyperglyceridemia: Secondary | ICD-10-CM

## 2023-05-24 MED ORDER — MECLIZINE HCL 25 MG PO TABS: 25.0000 mg | ORAL_TABLET | Freq: Three times a day (TID) | ORAL | 0 refills | Status: AC | PRN

## 2023-05-24 NOTE — Progress Notes (Signed)
Subjective:    Patient ID: Lisa Gates, female    DOB: 09/12/1965, 57 y.o.   MRN: [id number]  HPI I have not seen patient in almost 4 years.  Patient is not currently taking any medication.  Last mammogram was 4/24.  She is due for  for pap smear.  She defers this at the present time.  She smokes so she is due for lung cancer screening with a CT scan.  She is also due for colon cancer screening.  She is also due for the pneumonia vaccine, a flu shot, and a shingles vaccine.  She requests fasting lab work.  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   Past Surgical History:  Procedure Laterality Date   BREAST BIOPSY Left 03/21/2018   BREAST EXCISIONAL BIOPSY Left 04/24/2018   benign   BREAST LUMPECTOMY WITH RADIOACTIVE SEED LOCALIZATION Left 04/24/2018   Procedure: LEFT BREAST LUMPECTOMY WITH RADIOACTIVE SEED LOCALIZATION;  Surgeon: Claud Kelp, MD;  Location: [facility];  Service: General;  Laterality: Left;   CESAREAN SECTION     x 2   DILATION AND CURETTAGE OF UTERUS  05/30/1987   SPINE SURGERY  05/29/2004   lumbar   TUBAL LIGATION       Allergies  Allergen Reactions   Penicillins Hives and Rash   Social History   Socioeconomic History   Marital status: Unknown    Spouse name: Not on file   Number of children: Not on file   Years of education: Not on file   Highest education level: Not on file  Occupational History   Not on file  Tobacco Use   Smoking status: Every Day    Current packs/day: 1.00    Types: Cigarettes   Smokeless tobacco: Never  Vaping Use   Vaping status: Former  Substance and Sexual Activity   Alcohol use: Not Currently    Comment: occasional   Drug use: Yes    Types:  Marijuana    Comment: pt states she smokes marijuana on occasion.   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   Married   2 children. 57 and 84 y/o.   Social Drivers of Corporate investment banker Strain: Low Risk  (01/26/2020)   Overall Financial Resource Strain (CARDIA)    Difficulty of Paying Living Expenses: Not very hard  Food Insecurity: No Food Insecurity (01/26/2020)   Hunger Vital Sign    Worried About Running Out of Food in the Last Year: Never true    Ran Out of Food in the Last Year: Never true  Transportation Needs: No Transportation Needs (01/26/2020)   PRAPARE - Administrator, Civil Service (Medical): No    Lack of Transportation (Non-Medical): No  Physical Activity: Insufficiently Active (01/26/2020)   Exercise Vital Sign    Days of Exercise per Week: 1 day    Minutes of Exercise per Session: 20 min  Stress: No Stress Concern Present (01/26/2020)   [employer] of Occupational Health - Occupational Stress Questionnaire    Feeling  of Stress : Only a little  Social Connections: Moderately Isolated (01/26/2020)   Social Connection and Isolation Panel [NHANES]    Frequency of Communication with Friends and Family: More than three times a week    Frequency of Social Gatherings with Friends and Family: More than three times a week    Attends Religious Services: More than 4 times per year    Active Member of [employer] or Organizations: No    Attends Banker Meetings: Never    Marital Status: Separated  Intimate Partner Violence: At Risk (01/26/2020)   Humiliation, Afraid, Rape, and Kick questionnaire    Fear of Current or Ex-Partner: No    Emotionally Abused: Yes    Physically Abused: No    Sexually Abused: No      Review of Systems  All other systems reviewed and are  negative.      Objective:   Physical Exam Vitals reviewed.  Constitutional:      General: She is not in acute distress.    Appearance: She is obese. She is not ill-appearing or toxic-appearing.  Cardiovascular:     Rate and Rhythm: Normal rate and regular rhythm.     Heart sounds: Normal heart sounds. No murmur heard.    No gallop.  Pulmonary:     Effort: Pulmonary effort is normal. No respiratory distress.     Breath sounds: Normal breath sounds. No wheezing or rales.  Abdominal:     General: Bowel sounds are normal.     Palpations: Abdomen is soft.  Neurological:     General: No focal deficit present.     Mental Status: She is alert and oriented to person, place, and time.     Cranial Nerves: No cranial nerve deficit.  Psychiatric:        Mood and Affect: Mood normal.        Behavior: Behavior normal.        Thought Content: Thought content normal.        Judgment: Judgment normal.           Assessment & Plan:  Hypertriglyceridemia - Plan: CBC with Differential/Platelet, COMPLETE METABOLIC PANEL WITH GFR, Lipid panel  Screening for lung cancer - Plan: CT CHEST LUNG CA SCREEN LOW DOSE W/O CM  Colon cancer screening - Plan: Cologuard I will schedule the patient for a CT scan of the lungs to screen for lung cancer.  Mammogram is up-to-date.  I will schedule a Cologuard to screen for colon cancer.  Recommend pneumonia vaccine, flu shot, and shingles vaccine.  Will check a CBC a CMP and a fasting lipid panel.  Patient can schedule her Pap smear at her earliest convenience.

## 2023-05-25 LAB — COMPLETE METABOLIC PANEL WITH GFR
AG Ratio: 1.8 (calc) (ref 1.0–2.5)
ALT: 26 U/L (ref 6–29)
AST: 30 U/L (ref 10–35)
Albumin: 4.4 g/dL (ref 3.6–5.1)
Alkaline phosphatase (APISO): 184 U/L — ABNORMAL HIGH (ref 37–153)
BUN/Creatinine Ratio: 22 (calc) (ref 6–22)
BUN: 11 mg/dL (ref 7–25)
CO2: 24 mmol/L (ref 20–32)
Calcium: 9.9 mg/dL (ref 8.6–10.4)
Chloride: 106 mmol/L (ref 98–110)
Creat: 0.49 mg/dL — ABNORMAL LOW (ref 0.50–1.03)
Globulin: 2.4 g/dL (ref 1.9–3.7)
Glucose, Bld: 79 mg/dL (ref 65–99)
Potassium: 4.6 mmol/L (ref 3.5–5.3)
Sodium: 140 mmol/L (ref 135–146)
Total Bilirubin: 0.4 mg/dL (ref 0.2–1.2)
Total Protein: 6.8 g/dL (ref 6.1–8.1)
eGFR: 111 mL/min/{1.73_m2} (ref >=60)

## 2023-05-25 LAB — CBC WITH DIFFERENTIAL/PLATELET
Absolute Lymphocytes: 1648 {cells}/uL (ref 850–3900)
Absolute Monocytes: 473 {cells}/uL (ref 200–950)
Basophils Absolute: 73 {cells}/uL (ref 0–200)
Basophils Relative: 1.4 %
Eosinophils Absolute: 109 {cells}/uL (ref 15–500)
Eosinophils Relative: 2.1 %
HCT: 43.9 % (ref 35.0–45.0)
Hemoglobin: 14.7 g/dL (ref 11.7–15.5)
MCH: 33.2 pg — ABNORMAL HIGH (ref 27.0–33.0)
MCHC: 33.5 g/dL (ref 32.0–36.0)
MCV: 99.1 fL (ref 80.0–100.0)
MPV: 11.5 fL (ref 7.5–12.5)
Monocytes Relative: 9.1 %
Neutro Abs: 2896 {cells}/uL (ref 1500–7800)
Neutrophils Relative %: 55.7 %
Platelets: 262 10*3/uL (ref 140–400)
RBC: 4.43 10*6/uL (ref 3.80–5.10)
RDW: 11.8 % (ref 11.0–15.0)
Total Lymphocyte: 31.7 %
WBC: 5.2 10*3/uL (ref 3.8–10.8)

## 2023-05-25 LAB — LIPID PANEL
Cholesterol: 256 mg/dL — ABNORMAL HIGH (ref <200)
HDL: 65 mg/dL (ref >=50)
LDL Cholesterol (Calc): 147 mg/dL — ABNORMAL HIGH
Non-HDL Cholesterol (Calc): 191 mg/dL — ABNORMAL HIGH (ref <130)
Total CHOL/HDL Ratio: 3.9 (calc) (ref <5.0)
Triglycerides: 268 mg/dL — ABNORMAL HIGH (ref <150)

## 2023-06-05 ENCOUNTER — Other Ambulatory Visit: Payer: Self-pay

## 2023-06-05 DIAGNOSIS — E781 Pure hyperglyceridemia: Secondary | ICD-10-CM

## 2023-06-05 MED ORDER — ROSUVASTATIN CALCIUM 10 MG PO TABS
10.0000 mg | ORAL_TABLET | Freq: Every day | ORAL | 1 refills | Status: AC
Start: 2023-06-05 — End: ?

## 2023-06-10 DIAGNOSIS — Z1211 Encounter for screening for malignant neoplasm of colon: Secondary | ICD-10-CM | POA: Diagnosis not present

## 2023-06-18 LAB — COLOGUARD: COLOGUARD: NEGATIVE

## 2023-06-27 ENCOUNTER — Encounter: Payer: Self-pay | Admitting: Family Medicine

## 2023-09-30 ENCOUNTER — Other Ambulatory Visit: Payer: Self-pay | Admitting: Family Medicine

## 2023-10-01 MED ORDER — ALPRAZOLAM 0.5 MG PO TABS: 0.5000 mg | ORAL_TABLET | Freq: Three times a day (TID) | ORAL | 0 refills | Status: AC | PRN
